# Patient Record
Sex: Female | Born: 1990 | Race: Black or African American | Hispanic: No | Marital: Single | Smoking: Current every day smoker
Health system: Southern US, Community
[De-identification: ages and names within clinical notes are randomized; demographics above are authoritative.]

## PROBLEM LIST (undated history)

## (undated) DIAGNOSIS — A539 Syphilis, unspecified: Secondary | ICD-10-CM

## (undated) HISTORY — DX: Syphilis, unspecified: A53.9

## (undated) HISTORY — PX: APPENDECTOMY: SHX54

---

## 2013-08-31 DIAGNOSIS — A539 Syphilis, unspecified: Secondary | ICD-10-CM

## 2013-08-31 HISTORY — DX: Syphilis, unspecified: A53.9

## 2016-04-13 ENCOUNTER — Other Ambulatory Visit: Payer: Self-pay | Admitting: Obstetrics and Gynecology

## 2016-04-13 ENCOUNTER — Ambulatory Visit (INDEPENDENT_AMBULATORY_CARE_PROVIDER_SITE_OTHER): Payer: Medicaid Other

## 2016-04-13 DIAGNOSIS — O358XX1 Maternal care for other (suspected) fetal abnormality and damage, fetus 1: Secondary | ICD-10-CM | POA: Diagnosis not present

## 2016-04-13 DIAGNOSIS — Z1389 Encounter for screening for other disorder: Secondary | ICD-10-CM

## 2016-04-13 DIAGNOSIS — O321XX1 Maternal care for breech presentation, fetus 1: Secondary | ICD-10-CM | POA: Diagnosis not present

## 2016-04-13 DIAGNOSIS — Z3A19 19 weeks gestation of pregnancy: Secondary | ICD-10-CM | POA: Diagnosis not present

## 2016-04-13 DIAGNOSIS — Z36 Encounter for antenatal screening of mother: Secondary | ICD-10-CM | POA: Diagnosis not present

## 2016-04-13 DIAGNOSIS — O3680X0 Pregnancy with inconclusive fetal viability, not applicable or unspecified: Secondary | ICD-10-CM

## 2016-04-13 DIAGNOSIS — O3492 Maternal care for abnormality of pelvic organ, unspecified, second trimester: Secondary | ICD-10-CM

## 2016-04-13 NOTE — Progress Notes (Signed)
US 18+5 wks,breech,cx 4.3 cm,fhr 153 bpm,efw 232 g,svp of fluid 4.2 cm,normal rt ov,simple corpus luteal cyst lt ov 3 x 2.8 x 2.6 cm,LVEICF 3.3 mm,post pl gr 0,anatomy complete

## 2016-04-15 ENCOUNTER — Encounter: Payer: Self-pay | Admitting: Advanced Practice Midwife

## 2016-04-15 ENCOUNTER — Ambulatory Visit (INDEPENDENT_AMBULATORY_CARE_PROVIDER_SITE_OTHER): Payer: Medicaid Other | Admitting: Advanced Practice Midwife

## 2016-04-15 VITALS — BP 108/60 | HR 70 | Ht 61.0 in | Wt 172.0 lb

## 2016-04-15 DIAGNOSIS — Z3A19 19 weeks gestation of pregnancy: Secondary | ICD-10-CM

## 2016-04-15 DIAGNOSIS — O0932 Supervision of pregnancy with insufficient antenatal care, second trimester: Secondary | ICD-10-CM

## 2016-04-15 DIAGNOSIS — Z3492 Encounter for supervision of normal pregnancy, unspecified, second trimester: Secondary | ICD-10-CM | POA: Diagnosis not present

## 2016-04-15 DIAGNOSIS — Z331 Pregnant state, incidental: Secondary | ICD-10-CM

## 2016-04-15 DIAGNOSIS — Z3482 Encounter for supervision of other normal pregnancy, second trimester: Secondary | ICD-10-CM

## 2016-04-15 DIAGNOSIS — Z3682 Encounter for antenatal screening for nuchal translucency: Secondary | ICD-10-CM

## 2016-04-15 DIAGNOSIS — Z0283 Encounter for blood-alcohol and blood-drug test: Secondary | ICD-10-CM

## 2016-04-15 DIAGNOSIS — Z1389 Encounter for screening for other disorder: Secondary | ICD-10-CM

## 2016-04-15 DIAGNOSIS — Z369 Encounter for antenatal screening, unspecified: Secondary | ICD-10-CM

## 2016-04-15 LAB — POCT URINALYSIS DIPSTICK
GLUCOSE UA: NEGATIVE
KETONES UA: NEGATIVE
Leukocytes, UA: NEGATIVE
Nitrite, UA: NEGATIVE
Protein, UA: NEGATIVE
RBC UA: NEGATIVE

## 2016-04-15 NOTE — Progress Notes (Signed)
error 

## 2016-04-15 NOTE — Patient Instructions (Signed)
Safe Medications in Pregnancy   Acne: Benzoyl Peroxide Salicylic Acid  Backache/Headache: Tylenol: 2 regular strength every 4 hours OR              2 Extra strength every 6 hours  Colds/Coughs/Allergies: Benadryl (alcohol free) 25 mg every 6 hours as needed Breath right strips Claritin Cepacol throat lozenges Chloraseptic throat spray Cold-Eeze- up to three times per day Cough drops, alcohol free Flonase (by prescription only) Guaifenesin Mucinex Robitussin DM (plain only, alcohol free) Saline nasal spray/drops Sudafed (pseudoephedrine) & Actifed ** use only after [redacted] weeks gestation and if you do not have high blood pressure Tylenol Vicks Vaporub Zinc lozenges Zyrtec   Constipation: Colace Ducolax suppositories Fleet enema Glycerin suppositories Metamucil Milk of magnesia Miralax Senokot Smooth move tea  Diarrhea: Kaopectate Imodium A-D  *NO pepto Bismol  Hemorrhoids: Anusol Anusol HC Preparation H Tucks  Indigestion: Tums Maalox Mylanta Zantac  Pepcid  Insomnia: Benadryl (alcohol free) 25mg every 6 hours as needed Tylenol PM Unisom, no Gelcaps  Leg Cramps: Tums MagGel  Nausea/Vomiting:  Bonine Dramamine Emetrol Ginger extract Sea bands Meclizine  Nausea medication to take during pregnancy:  Unisom (doxylamine succinate 25 mg tablets) Take one tablet daily at bedtime. If symptoms are not adequately controlled, the dose can be increased to a maximum recommended dose of two tablets daily (1/2 tablet in the morning, 1/2 tablet mid-afternoon and one at bedtime). Vitamin B6 100mg tablets. Take one tablet twice a day (up to 200 mg per day).  Skin Rashes: Aveeno products Benadryl cream or 25mg every 6 hours as needed Calamine Lotion 1% cortisone cream  Yeast infection: Gyne-lotrimin 7 Monistat 7   **If taking multiple medications, please check labels to avoid duplicating the same active ingredients **take medication as directed on  the label ** Do not exceed 4000 mg of tylenol in 24 hours **Do not take medications that contain aspirin or ibuprofen       Second Trimester of Pregnancy The second trimester is from week 13 through week 28, months 4 through 6. The second trimester is often a time when you feel your best. Your body has also adjusted to being pregnant, and you begin to feel better physically. Usually, morning sickness has lessened or quit completely, you may have more energy, and you may have an increase in appetite. The second trimester is also a time when the fetus is growing rapidly. At the end of the sixth month, the fetus is about 9 inches long and weighs about 1 pounds. You will likely begin to feel the baby move (quickening) between 18 and 20 weeks of the pregnancy. BODY CHANGES Your body goes through many changes during pregnancy. The changes vary from woman to woman.   Your weight will continue to increase. You will notice your lower abdomen bulging out.  You may begin to get stretch marks on your hips, abdomen, and breasts.  You may develop headaches that can be relieved by medicines approved by your health care provider.  You may urinate more often because the fetus is pressing on your bladder.  You may develop or continue to have heartburn as a result of your pregnancy.  You may develop constipation because certain hormones are causing the muscles that push waste through your intestines to slow down.  You may develop hemorrhoids or swollen, bulging veins (varicose veins).  You may have back pain because of the weight gain and pregnancy hormones relaxing your joints between the bones in your pelvis and as   a result of a shift in weight and the muscles that support your balance.  Your breasts will continue to grow and be tender.  Your gums may bleed and may be sensitive to brushing and flossing.  Dark spots or blotches (chloasma, mask of pregnancy) may develop on your face. This will likely  fade after the baby is born.  A dark line from your belly button to the pubic area (linea nigra) may appear. This will likely fade after the baby is born.  You may have changes in your hair. These can include thickening of your hair, rapid growth, and changes in texture. Some women also have hair loss during or after pregnancy, or hair that feels dry or thin. Your hair will most likely return to normal after your baby is born. WHAT TO EXPECT AT YOUR PRENATAL VISITS During a routine prenatal visit:  You will be weighed to make sure you and the fetus are growing normally.  Your blood pressure will be taken.  Your abdomen will be measured to track your baby's growth.  The fetal heartbeat will be listened to.  Any test results from the previous visit will be discussed. Your health care provider may ask you:  How you are feeling.  If you are feeling the baby move.  If you have had any abnormal symptoms, such as leaking fluid, bleeding, severe headaches, or abdominal cramping.  If you are using any tobacco products, including cigarettes, chewing tobacco, and electronic cigarettes.  If you have any questions. Other tests that may be performed during your second trimester include:  Blood tests that check for:  Low iron levels (anemia).  Gestational diabetes (between 24 and 28 weeks).  Rh antibodies.  Urine tests to check for infections, diabetes, or protein in the urine.  An ultrasound to confirm the proper growth and development of the baby.  An amniocentesis to check for possible genetic problems.  Fetal screens for spina bifida and Down syndrome.  HIV (human immunodeficiency virus) testing. Routine prenatal testing includes screening for HIV, unless you choose not to have this test. HOME CARE INSTRUCTIONS   Avoid all smoking, herbs, alcohol, and unprescribed drugs. These chemicals affect the formation and growth of the baby.  Do not use any tobacco products, including  cigarettes, chewing tobacco, and electronic cigarettes. If you need help quitting, ask your health care provider. You may receive counseling support and other resources to help you quit.  Follow your health care provider's instructions regarding medicine use. There are medicines that are either safe or unsafe to take during pregnancy.  Exercise only as directed by your health care provider. Experiencing uterine cramps is a good sign to stop exercising.  Continue to eat regular, healthy meals.  Wear a good support bra for breast tenderness.  Do not use hot tubs, steam rooms, or saunas.  Wear your seat belt at all times when driving.  Avoid raw meat, uncooked cheese, cat litter boxes, and soil used by cats. These carry germs that can cause birth defects in the baby.  Take your prenatal vitamins.  Take 1500-2000 mg of calcium daily starting at the 20th week of pregnancy until you deliver your baby.  Try taking a stool softener (if your health care provider approves) if you develop constipation. Eat more high-fiber foods, such as fresh vegetables or fruit and whole grains. Drink plenty of fluids to keep your urine clear or pale yellow.  Take warm sitz baths to soothe any pain or discomfort caused by   hemorrhoids. Use hemorrhoid cream if your health care provider approves.  If you develop varicose veins, wear support hose. Elevate your feet for 15 minutes, 3-4 times a day. Limit salt in your diet.  Avoid heavy lifting, wear low heel shoes, and practice good posture.  Rest with your legs elevated if you have leg cramps or low back pain.  Visit your dentist if you have not gone yet during your pregnancy. Use a soft toothbrush to brush your teeth and be gentle when you floss.  A sexual relationship may be continued unless your health care provider directs you otherwise.  Continue to go to all your prenatal visits as directed by your health care provider. SEEK MEDICAL CARE IF:   You have  dizziness.  You have mild pelvic cramps, pelvic pressure, or nagging pain in the abdominal area.  You have persistent nausea, vomiting, or diarrhea.  You have a bad smelling vaginal discharge.  You have pain with urination. SEEK IMMEDIATE MEDICAL CARE IF:   You have a fever.  You are leaking fluid from your vagina.  You have spotting or bleeding from your vagina.  You have severe abdominal cramping or pain.  You have rapid weight gain or loss.  You have shortness of breath with chest pain.  You notice sudden or extreme swelling of your face, hands, ankles, feet, or legs.  You have not felt your baby move in over an hour.  You have severe headaches that do not go away with medicine.  You have vision changes.   This information is not intended to replace advice given to you by your health care provider. Make sure you discuss any questions you have with your health care provider.   Document Released: 08/11/2001 Document Revised: 09/07/2014 Document Reviewed: 10/18/2012 Elsevier Interactive Patient Education 2016 [employer]  

## 2016-04-15 NOTE — Progress Notes (Addendum)
  Subjective:    Kendra Farmer is a G4P3000 6062w0d being seen today for her first obstetrical visit.  Her obstetrical history is significant for 3 term SVD.  Pregnancy history fully reviewed. Had been in jail for amonth ("old probabtion stuff").   Patient reports no complaints.  Vitals:   04/15/16 1004 04/15/16 1005 04/15/16 1030  BP: 108/60    Pulse: 70    Weight: 172 lb (78 kg)    Height:  5\' 1"  (1.549 m) 5\' 1"  (1.549 m)    HISTORY: OB History  Gravida Para Term Preterm AB Living  4 3 3     3   SAB TAB Ectopic Multiple Live Births          3    # Outcome Date GA Lbr Len/2nd Weight Sex Delivery Anes PTL Lv  4 Current           3 Term 06/30/15 6738w0d  6 lb 1 oz (2.75 kg) F Vag-Spont None N LIV  2 Term 08/13/14 153w0d  7 lb 1 oz (3.204 kg) F Vag-Spont Gen N LIV  1 Term 04/23/11 9053w0d  6 lb 1 oz (2.75 kg) F Vag-Spont None N LIV     Past Medical History:  Diagnosis Date  . Syphilis 2015   Past Surgical History:  Procedure Laterality Date  . APPENDECTOMY     History reviewed. No pertinent family history.   Exam                                      System:     Skin: normal coloration and turgor, no rashes    Neurologic: oriented, normal, normal mood   Extremities: normal strength, tone, and muscle mass   HEENT PERRLA   Mouth/Teeth mucous membranes moist, normal dentition   Neck supple and no masses   Cardiovascular: regular rate and rhythm   Respiratory:  appears well, vitals normal, no respiratory distress, acyanotic   Abdomen: soft, non-tender;  FHR: 150           Assessment:    Pregnancy: G4P3000 There are no active problems to display for this patient.       Plan:     Initial labs drawn. Continue prenatal vitamins  Problem list reviewed and updated  Reviewed recommended weight gain based on pre-gravid BMI  Encouraged well-balanced diet Genetic Screening discussed Quad Screen: requested.  Ultrasound discussed; fetal survey: results  reviewed.  Return in about 4 weeks (around 05/13/2016) for [name]  Record request (pap smear) [facility] , [name].  CRESENZO-DISHMAN,Ronen Bromwell 04/22/2016

## 2016-04-16 LAB — GC/CHLAMYDIA PROBE AMP
Chlamydia trachomatis, NAA: NEGATIVE
Neisseria gonorrhoeae by PCR: NEGATIVE

## 2016-04-17 LAB — URINE CULTURE

## 2016-04-22 ENCOUNTER — Encounter: Payer: Medicaid Other | Admitting: Women's Health

## 2016-04-22 DIAGNOSIS — Z3490 Encounter for supervision of normal pregnancy, unspecified, unspecified trimester: Secondary | ICD-10-CM | POA: Insufficient documentation

## 2016-04-22 DIAGNOSIS — O0930 Supervision of pregnancy with insufficient antenatal care, unspecified trimester: Secondary | ICD-10-CM | POA: Insufficient documentation

## 2016-04-22 LAB — PMP SCREEN PROFILE (10S), URINE
AMPHETAMINE SCRN UR: NEGATIVE ng/mL
Barbiturate Screen, Ur: NEGATIVE ng/mL
Benzodiazepine Screen, Urine: NEGATIVE ng/mL
CANNABINOIDS UR QL SCN: NEGATIVE ng/mL
COCAINE(METAB.) SCREEN, URINE: NEGATIVE ng/mL
Creatinine(Crt), U: 136.9 mg/dL (ref 20.0–300.0)
Methadone Scn, Ur: NEGATIVE ng/mL
OPIATE SCRN UR: NEGATIVE ng/mL
OXYCODONE+OXYMORPHONE UR QL SCN: NEGATIVE ng/mL
PCP SCRN UR: NEGATIVE ng/mL
Ph of Urine: 5.7 (ref 4.5–8.9)
Propoxyphene, Screen: NEGATIVE ng/mL

## 2016-04-22 LAB — CYSTIC FIBROSIS MUTATION 97
CF Image: 0
Interpretation: NOT DETECTED

## 2016-04-22 LAB — URINALYSIS, ROUTINE W REFLEX MICROSCOPIC
BILIRUBIN UA: NEGATIVE
Glucose, UA: NEGATIVE
Ketones, UA: NEGATIVE
Nitrite, UA: NEGATIVE
PH UA: 6 (ref 5.0–7.5)
Protein, UA: NEGATIVE
RBC UA: NEGATIVE
Specific Gravity, UA: 1.024 (ref 1.005–1.030)
Urobilinogen, Ur: 0.2 mg/dL (ref 0.2–1.0)

## 2016-04-22 LAB — CBC
HEMOGLOBIN: 10.6 g/dL — AB (ref 11.1–15.9)
Hematocrit: 32.8 % — ABNORMAL LOW (ref 34.0–46.6)
MCH: 25.8 pg — AB (ref 26.6–33.0)
MCHC: 32.3 g/dL (ref 31.5–35.7)
MCV: 80 fL (ref 79–97)
Platelets: 264 10*3/uL (ref 150–379)
RBC: 4.11 x10E6/uL (ref 3.77–5.28)
RDW: 15.9 % — ABNORMAL HIGH (ref 12.3–15.4)
WBC: 7.1 10*3/uL (ref 3.4–10.8)

## 2016-04-22 LAB — AFP, QUAD SCREEN
DIA Mom Value: 0.75
DIA Value (EIA): 128.62 pg/mL
DSR (BY AGE) 1 IN: 998
DSR (SECOND TRIMESTER) 1 IN: 9242
Gestational Age: 19 WEEKS
MATERNAL AGE AT EDD: 25.7 a
MSAFP MOM: 0.97
MSAFP: 47.8 ng/mL
MSHCG Mom: 1.47
MSHCG: 34209 m[IU]/mL
OSB RISK: 10000
PDF: 0
T18 (By Age): 1:3887 {titer}
Test Results:: NEGATIVE
UE3 VALUE: 1.47 ng/mL
Weight: 172 [lb_av]
uE3 Mom: 0.95

## 2016-04-22 LAB — RPR: RPR Ser Ql: NONREACTIVE

## 2016-04-22 LAB — MICROSCOPIC EXAMINATION: Casts: NONE SEEN /lpf

## 2016-04-22 LAB — ABO/RH: Rh Factor: POSITIVE

## 2016-04-22 LAB — HEPATITIS B SURFACE ANTIGEN: HEP B S AG: NEGATIVE

## 2016-04-22 LAB — SICKLE CELL SCREEN: SICKLE CELL SCREEN: NEGATIVE

## 2016-04-22 LAB — RUBELLA SCREEN: Rubella Antibodies, IGG: 1.4 index (ref >0.99)

## 2016-04-22 LAB — VARICELLA ZOSTER ANTIBODY, IGG: Varicella zoster IgG: <135 index — ABNORMAL LOW (ref >165)

## 2016-04-22 LAB — HIV ANTIBODY (ROUTINE TESTING W REFLEX): HIV SCREEN 4TH GENERATION: NONREACTIVE

## 2016-04-22 LAB — ANTIBODY SCREEN: ANTIBODY SCREEN: NEGATIVE

## 2016-04-27 ENCOUNTER — Encounter: Payer: Medicaid Other | Admitting: Women's Health

## 2016-04-29 ENCOUNTER — Encounter (HOSPITAL_COMMUNITY): Payer: Self-pay

## 2016-04-29 ENCOUNTER — Emergency Department (HOSPITAL_COMMUNITY): Payer: Worker's Compensation

## 2016-04-29 ENCOUNTER — Emergency Department (HOSPITAL_COMMUNITY)
Admission: EM | Admit: 2016-04-29 | Discharge: 2016-04-29 | Disposition: A | Discharge status: Home / Self Care | Payer: Worker's Compensation | Attending: Emergency Medicine | Admitting: Emergency Medicine

## 2016-04-29 DIAGNOSIS — Y990 Civilian activity done for income or pay: Secondary | ICD-10-CM | POA: Insufficient documentation

## 2016-04-29 DIAGNOSIS — S61210A Laceration without foreign body of right index finger without damage to nail, initial encounter: Secondary | ICD-10-CM | POA: Insufficient documentation

## 2016-04-29 DIAGNOSIS — S61219A Laceration without foreign body of unspecified finger without damage to nail, initial encounter: Secondary | ICD-10-CM

## 2016-04-29 DIAGNOSIS — Y939 Activity, unspecified: Secondary | ICD-10-CM | POA: Diagnosis not present

## 2016-04-29 DIAGNOSIS — Y929 Unspecified place or not applicable: Secondary | ICD-10-CM | POA: Diagnosis not present

## 2016-04-29 DIAGNOSIS — S61212A Laceration without foreign body of right middle finger without damage to nail, initial encounter: Secondary | ICD-10-CM | POA: Diagnosis not present

## 2016-04-29 DIAGNOSIS — W270XXA Contact with workbench tool, initial encounter: Secondary | ICD-10-CM | POA: Insufficient documentation

## 2016-04-29 DIAGNOSIS — Z87891 Personal history of nicotine dependence: Secondary | ICD-10-CM | POA: Diagnosis not present

## 2016-04-29 MED ORDER — POVIDONE-IODINE 10 % EX SOLN
CUTANEOUS | Status: AC
  Filled 2016-04-29: qty 118

## 2016-04-29 MED ORDER — LIDOCAINE HCL (PF) 2 % IJ SOLN
10.0000 mL | Freq: Once | INTRAMUSCULAR | Status: DC
  Filled 2016-04-29: qty 10

## 2016-04-29 MED ORDER — HYDROCODONE-ACETAMINOPHEN 5-325 MG PO TABS: 1.0000 | ORAL_TABLET | Freq: Once | ORAL | Status: DC

## 2016-04-29 MED ORDER — HYDROCODONE-ACETAMINOPHEN 5-325 MG PO TABS: 1.0000 | ORAL_TABLET | ORAL | 0 refills | Status: DC | PRN

## 2016-04-29 MED ORDER — HYDROCODONE-ACETAMINOPHEN 5-325 MG PO TABS
ORAL_TABLET | ORAL | Status: AC
  Filled 2016-04-29: qty 1

## 2016-04-29 MED ORDER — CEPHALEXIN 500 MG PO CAPS: 500.0000 mg | ORAL_CAPSULE | Freq: Four times a day (QID) | ORAL | 0 refills | Status: DC

## 2016-04-29 NOTE — Discharge Instructions (Signed)
As discussed,  call Dr. Melvyn Novasrtmann for a recheck of your finger injuries within the next several days.  In the interim, keep your wounds clean and covered.  Take the medicine prescribed to help prevent infection.  You may take the hydrocodone prescribed for pain relief.  This will make you drowsy - do not drive within 4 hours of taking this medication.

## 2016-04-29 NOTE — ED Triage Notes (Signed)
Pt cut tip of r index finger and middle finger on a saw at work today.  Part of the Tip of index finger appears amputated.

## 2016-04-30 NOTE — ED Provider Notes (Signed)
[facility]-EMER[facility]ENCY DEPT Provider Note   CSN: [id number] Arrival date & time: 04/29/16  1519     History   Chief Complaint Chief Complaint  Patient presents with  . Extremity Laceration    HPI Kendra Farmer is a 25 y.o.right handed female presenting with lacerations to her right index and long fingers which occurred at work prior to arrival when she accidentally reached too close to a moving saw blade.  She is right handed and is utd with her tetanus status.  She reports that she has sensation distal to the injury site.  The wounds bled copiously but has resolved with application of pressure. .  Of note, pt is currently 5 months pregnant.  HPI  Past Medical History:  Diagnosis Date  . Syphilis 2015    Patient Active Problem List   Diagnosis Date Noted  . Supervision of normal pregnancy 04/22/2016  . Late prenatal care 04/22/2016    Past Surgical History:  Procedure Laterality Date  . APPENDECTOMY      OB History    Gravida Para Term Preterm AB Living   4 3 3     3    SAB TAB Ectopic Multiple Live Births           3       Home Medications    Prior to Admission medications   Medication Sig Start Date End Date Taking? Authorizing Provider  cephALEXin (KEFLEX) 500 MG capsule Take 1 capsule (500 mg total) by mouth 4 (four) times daily. 04/29/16   Burgess AmorJulie Cayetano Mikita, PA-C  ferrous sulfate 325 (65 FE) MG tablet Take 325 mg by mouth daily with breakfast.    Historical Provider, MD  HYDROcodone-acetaminophen (NORCO/VICODIN) 5-325 MG tablet Take 1 tablet by mouth every 4 (four) hours as needed. 04/29/16   Burgess AmorJulie Devarius Nelles, PA-C  HYDROcodone-acetaminophen (NORCO/VICODIN) 5-325 MG tablet Take 1 tablet by mouth every 4 (four) hours as needed. 04/29/16   Burgess AmorJulie Willa Brocks, PA-C  Prenatal Vit-Fe Fumarate-FA (MULTIVITAMIN-PRENATAL) 27-0.8 MG TABS tablet Take 1 tablet by mouth daily at 12 noon.    Historical Provider, MD    Family History No family history on file.  Social History Social History    Substance Use Topics  . Smoking status: Former Games developermoker  . Smokeless tobacco: Never Used  . Alcohol use No     Allergies   Review of patient's allergies indicates no known allergies.   Review of Systems Review of Systems  Constitutional: Negative for chills and fever.  Respiratory: Negative for shortness of breath and wheezing.   Skin: Positive for wound.  Neurological: Negative for weakness and numbness.     Physical Exam Updated Vital Signs BP 110/88 (BP Location: Left Arm)   Pulse 86   Temp 98.5 F (36.9 C) (Oral)   Resp 20   Ht 5\' 1"  (1.549 m)   Wt 78 kg   LMP 12/04/2015 (Exact Date)   SpO2 98%   BMI 32.50 kg/m   Physical Exam  Constitutional: She is oriented to person, place, and time. She appears well-developed and well-nourished.  HENT:  Head: Normocephalic.  Cardiovascular: Normal rate.   Pulmonary/Chest: Effort normal.  Musculoskeletal: She exhibits tenderness.  Neurological: She is alert and oriented to person, place, and time. No sensory deficit.  Skin: Laceration noted.  Deep avulsive soft tissue injuries to the distal volar tuft of right index and long fingers both injuries approximately 1 cm in greatest diameter. Hemostatic.  Tip of both fingers including nails and nail beds intact.  Distal sensation intact. Hemostatic.     ED Treatments / Results  Labs (all labs ordered are listed, but only abnormal results are displayed) Labs Reviewed - No data to display  EKG  EKG Interpretation None       Radiology Dg Hand Complete Right  Result Date: 04/29/2016 CLINICAL DATA:  Avulsive lacerations to index and middle fingers using saw at work today. Initial encounter. EXAM: RIGHT HAND - COMPLETE 3+ VIEW COMPARISON:  None. FINDINGS: There is no evidence of fracture or dislocation. There is no evidence of arthropathy or other focal bone abnormality. Soft tissue lacerations are seen involving the distal index and middle fingers, however there is no  evidence of radiopaque foreign body. IMPRESSION: Soft tissue lacerations involving distal index and middle fingers. No evidence of fracture or radiopaque foreign body. Electronically Signed   By: Myles Rosenthal M.D.   On: 04/29/2016 16:27    Procedures .Nerve Block Date/Time: 04/29/2016 3:50 PM Performed by: Burgess Amor Authorized by: Burgess Amor   Consent:    Consent obtained:  Verbal   Consent given by:  Patient   Risks discussed:  Infection, bleeding, pain and swelling   Alternatives discussed:  No treatment Indications:    Indications:  Pain relief Location:    Body area:  Upper extremity   Upper extremity nerve blocked: digital.   Laterality:  Right Pre-procedure details:    Skin preparation:  Alcohol   Preparation: Patient was prepped and draped in usual sterile fashion   Skin anesthesia (see MAR for exact dosages):    Skin anesthesia method:  Local infiltration Procedure details (see MAR for exact dosages):    Block needle gauge:  25 G   Anesthetic injected:  Lidocaine 2% w/o epi   Steroid injected:  None   Additive injected:  None   Injection procedure:  Anatomic landmarks identified   Paresthesia:  Immediately resolved Post-procedure details:    Dressing:  Sterile dressing   Outcome:  Pain relieved   Patient tolerance of procedure:  Tolerated well, no immediate complications   (including critical care time)    Medications Ordered in ED Medications - No data to display   Initial Impression / Assessment and Plan / ED Course  I have reviewed the triage vital signs and the nursing notes.  Pertinent labs & imaging results that were available during my care of the patient were reviewed by me and considered in my medical decision making (see chart for details).  Clinical Course    Pt's injuries discussed with Dr. Romeo Apple, my concern given the depth of the wounds, pt may need f/u revision/grafting or other procedure as these wounds may be too deep for adequate  granulation and self healing.  Deferred to hand specialty.   Call placed to Dr. Melvyn Novas on call for hand today.  No return call after 30 minutes and pt unwilling to wait longer.  Pt was given referral information for f/u care and understands need to call for f/u office recheck.  Employer at bedside also understands and will help pt get f/u care as needed. She was placed on keflex to help infection prophylaxis, hydrocodone prescribed prn pain.  Bulky dressings applied after application of xeroform on the wounds.    Final Clinical Impressions(s) / ED Diagnoses   Final diagnoses:  Finger laceration, initial encounter    New Prescriptions Discharge Medication List as of 04/29/2016  5:58 PM    START taking these medications   Details  cephALEXin (KEFLEX) 500 MG  capsule Take 1 capsule (500 mg total) by mouth 4 (four) times daily., Starting Wed 04/29/2016, Print    !! HYDROcodone-acetaminophen (NORCO/VICODIN) 5-325 MG tablet Take 1 tablet by mouth every 4 (four) hours as needed., Starting Wed 04/29/2016, Print    !! HYDROcodone-acetaminophen (NORCO/VICODIN) 5-325 MG tablet Take 1 tablet by mouth every 4 (four) hours as needed., Starting Wed 04/29/2016, Print     !! - Potential duplicate medications found. Please discuss with provider.       Burgess Amor, PA-C 04/30/16 1610    Raeford Razor, MD 05/06/16 [phone]

## 2016-05-05 MED FILL — Hydrocodone-Acetaminophen Tab 5-325 MG: ORAL | Qty: 6 | Status: AC

## 2016-05-13 ENCOUNTER — Ambulatory Visit (INDEPENDENT_AMBULATORY_CARE_PROVIDER_SITE_OTHER): Payer: Medicaid Other | Admitting: Women's Health

## 2016-05-13 ENCOUNTER — Encounter: Payer: Self-pay | Admitting: Women's Health

## 2016-05-13 VITALS — BP 98/58 | HR 66 | Wt 169.0 lb

## 2016-05-13 DIAGNOSIS — Z3492 Encounter for supervision of normal pregnancy, unspecified, second trimester: Secondary | ICD-10-CM

## 2016-05-13 DIAGNOSIS — Z1389 Encounter for screening for other disorder: Secondary | ICD-10-CM

## 2016-05-13 DIAGNOSIS — O283 Abnormal ultrasonic finding on antenatal screening of mother: Secondary | ICD-10-CM

## 2016-05-13 DIAGNOSIS — Z331 Pregnant state, incidental: Secondary | ICD-10-CM

## 2016-05-13 LAB — POCT URINALYSIS DIPSTICK
Blood, UA: NEGATIVE
GLUCOSE UA: NEGATIVE
KETONES UA: NEGATIVE
LEUKOCYTES UA: NEGATIVE
NITRITE UA: NEGATIVE
Protein, UA: NEGATIVE

## 2016-05-13 NOTE — Patient Instructions (Signed)
You will have your sugar test next visit.  Please do not eat or drink anything after midnight the night before you come, not even water.  You will be here for at least two hours.     Call the office ([phone]) or go to [facility] if:  You begin to have strong, frequent contractions  Your water breaks.  Sometimes it is a big gush of fluid, sometimes it is just a trickle that keeps getting your panties wet or running down your legs  You have vaginal bleeding.  It is normal to have a small amount of spotting if your cervix was checked.   You don't feel your baby moving like normal.  If you don't, get you something to eat and drink and lay down and focus on feeling your baby move.   If your baby is still not moving like normal, you should call the office or go to [facility].  Second Trimester of Pregnancy The second trimester is from week 13 through week 28, months 4 through 6. The second trimester is often a time when you feel your best. Your body has also adjusted to being pregnant, and you begin to feel better physically. Usually, morning sickness has lessened or quit completely, you may have more energy, and you may have an increase in appetite. The second trimester is also a time when the fetus is growing rapidly. At the end of the sixth month, the fetus is about 9 inches long and weighs about 1 pounds. You will likely begin to feel the baby move (quickening) between 18 and 20 weeks of the pregnancy. BODY CHANGES Your body goes through many changes during pregnancy. The changes vary from woman to woman.   Your weight will continue to increase. You will notice your lower abdomen bulging out.  You may begin to get stretch marks on your hips, abdomen, and breasts.  You may develop headaches that can be relieved by medicines approved by your health care provider.  You may urinate more often because the fetus is pressing on your bladder.  You may develop or continue to have  heartburn as a result of your pregnancy.  You may develop constipation because certain hormones are causing the muscles that push waste through your intestines to slow down.  You may develop hemorrhoids or swollen, bulging veins (varicose veins).  You may have back pain because of the weight gain and pregnancy hormones relaxing your joints between the bones in your pelvis and as a result of a shift in weight and the muscles that support your balance.  Your breasts will continue to grow and be tender.  Your gums may bleed and may be sensitive to brushing and flossing.  Dark spots or blotches (chloasma, mask of pregnancy) may develop on your face. This will likely fade after the baby is born.  A dark line from your belly button to the pubic area (linea nigra) may appear. This will likely fade after the baby is born.  You may have changes in your hair. These can include thickening of your hair, rapid growth, and changes in texture. Some women also have hair loss during or after pregnancy, or hair that feels dry or thin. Your hair will most likely return to normal after your baby is born. WHAT TO EXPECT AT YOUR PRENATAL VISITS During a routine prenatal visit:  You will be weighed to make sure you and the fetus are growing normally.  Your blood pressure will be taken.    Your abdomen will be measured to track your baby's growth.  The fetal heartbeat will be listened to.  Any test results from the previous visit will be discussed. Your health care provider may ask you:  How you are feeling.  If you are feeling the baby move.  If you have had any abnormal symptoms, such as leaking fluid, bleeding, severe headaches, or abdominal cramping.  If you have any questions. Other tests that may be performed during your second trimester include:  Blood tests that check for:  Low iron levels (anemia).  Gestational diabetes (between 24 and 28 weeks).  Rh antibodies.  Urine tests to check  for infections, diabetes, or protein in the urine.  An ultrasound to confirm the proper growth and development of the baby.  An amniocentesis to check for possible genetic problems.  Fetal screens for spina bifida and Down syndrome. HOME CARE INSTRUCTIONS   Avoid all smoking, herbs, alcohol, and unprescribed drugs. These chemicals affect the formation and growth of the baby.  Follow your health care provider's instructions regarding medicine use. There are medicines that are either safe or unsafe to take during pregnancy.  Exercise only as directed by your health care provider. Experiencing uterine cramps is a good sign to stop exercising.  Continue to eat regular, healthy meals.  Wear a good support bra for breast tenderness.  Do not use hot tubs, steam rooms, or saunas.  Wear your seat belt at all times when driving.  Avoid raw meat, uncooked cheese, cat litter boxes, and soil used by cats. These carry germs that can cause birth defects in the baby.  Take your prenatal vitamins.  Try taking a stool softener (if your health care provider approves) if you develop constipation. Eat more high-fiber foods, such as fresh vegetables or fruit and whole grains. Drink plenty of fluids to keep your urine clear or pale yellow.  Take warm sitz baths to soothe any pain or discomfort caused by hemorrhoids. Use hemorrhoid cream if your health care provider approves.  If you develop varicose veins, wear support hose. Elevate your feet for 15 minutes, 3-4 times a day. Limit salt in your diet.  Avoid heavy lifting, wear low heel shoes, and practice good posture.  Rest with your legs elevated if you have leg cramps or low back pain.  Visit your dentist if you have not gone yet during your pregnancy. Use a soft toothbrush to brush your teeth and be gentle when you floss.  A sexual relationship may be continued unless your health care provider directs you otherwise.  Continue to go to all your  prenatal visits as directed by your health care provider. SEEK MEDICAL CARE IF:   You have dizziness.  You have mild pelvic cramps, pelvic pressure, or nagging pain in the abdominal area.  You have persistent nausea, vomiting, or diarrhea.  You have a bad smelling vaginal discharge.  You have pain with urination. SEEK IMMEDIATE MEDICAL CARE IF:   You have a fever.  You are leaking fluid from your vagina.  You have spotting or bleeding from your vagina.  You have severe abdominal cramping or pain.  You have rapid weight gain or loss.  You have shortness of breath with chest pain.  You notice sudden or extreme swelling of your face, hands, ankles, feet, or legs.  You have not felt your baby move in over an hour.  You have severe headaches that do not go away with medicine.  You have vision changes.   Document Released: 08/11/2001 Document Revised: 08/22/2013 Document Reviewed: 10/18/2012 ExitCare Patient Information 2015 [employer]. This information is not intended to replace advice given to you by your health care provider. Make sure you discuss any questions you have with your health care provider.     

## 2016-05-13 NOTE — Progress Notes (Signed)
Low-risk OB appointment [id number] 3819w0d Estimated Date of Delivery: 09/09/16 BP (!) 98/58   Pulse 66   Wt 169 lb (76.7 kg)   LMP 12/04/2015 (Exact Date)   BMI 31.93 kg/m   BP, weight, reviewed.  Unable to void- will try again before she leaves. Refer to obstetrical flow sheet for FH & FHR.  Reports good fm.  Denies regular uc's, lof, vb, or uti s/s. No complaints. Reviewed anatomy u/s- was unaware of EICF- gave printed info, discussed neg genetic screening, no need for f/u. Discussed ptl s/s, fm Plan:  Continue routine obstetrical care  F/U in 4wks for OB appointment and pn2

## 2016-06-11 ENCOUNTER — Encounter: Payer: Medicaid Other | Admitting: Women's Health

## 2016-06-11 ENCOUNTER — Other Ambulatory Visit: Payer: Medicaid Other

## 2016-06-17 ENCOUNTER — Encounter: Payer: Self-pay | Admitting: Women's Health

## 2016-06-17 ENCOUNTER — Ambulatory Visit (INDEPENDENT_AMBULATORY_CARE_PROVIDER_SITE_OTHER): Payer: Medicaid Other | Admitting: Women's Health

## 2016-06-17 ENCOUNTER — Other Ambulatory Visit: Payer: Medicaid Other

## 2016-06-17 VITALS — BP 116/70 | HR 76 | Wt 173.0 lb

## 2016-06-17 DIAGNOSIS — Z331 Pregnant state, incidental: Secondary | ICD-10-CM

## 2016-06-17 DIAGNOSIS — Z131 Encounter for screening for diabetes mellitus: Secondary | ICD-10-CM

## 2016-06-17 DIAGNOSIS — Z23 Encounter for immunization: Secondary | ICD-10-CM

## 2016-06-17 DIAGNOSIS — Z3483 Encounter for supervision of other normal pregnancy, third trimester: Secondary | ICD-10-CM

## 2016-06-17 DIAGNOSIS — Z1389 Encounter for screening for other disorder: Secondary | ICD-10-CM

## 2016-06-17 DIAGNOSIS — Z3A38 38 weeks gestation of pregnancy: Secondary | ICD-10-CM

## 2016-06-17 LAB — POCT URINALYSIS DIPSTICK
Blood, UA: NEGATIVE
Glucose, UA: NEGATIVE
Ketones, UA: NEGATIVE
LEUKOCYTES UA: NEGATIVE
Nitrite, UA: NEGATIVE
PROTEIN UA: NEGATIVE

## 2016-06-17 NOTE — Progress Notes (Signed)
Low-risk OB appointment [id number] 5866w0d Estimated Date of Delivery: 09/09/16 BP 116/70   Pulse 76   Wt 173 lb (78.5 kg)   LMP 12/04/2015 (Exact Date)   BMI 32.69 kg/m   BP, weight, and urine reviewed.  Refer to obstetrical flow sheet for FH & FHR.  Reports good fm.  Denies regular uc's, lof, vb, or uti s/s. No complaints. Wants BTL, discussed in-hospital vs. Interval salpingectomy to also lower risk of ovarian cancer in future- pt wants in-hospital BTL. Discussed risks/benefits, consent signed today Reviewed ptl s/s, fkc. Recommended Tdap at HD/PCP per CDC guidelines.  Plan:  Continue routine obstetrical care  F/U in 4wks for OB appointment  PN2 and flu shot today

## 2016-06-17 NOTE — Patient Instructions (Signed)

## 2016-06-18 LAB — CBC
HEMATOCRIT: 34.2 % (ref 34.0–46.6)
Hemoglobin: 11.6 g/dL (ref 11.1–15.9)
MCH: 26.9 pg (ref 26.6–33.0)
MCHC: 33.9 g/dL (ref 31.5–35.7)
MCV: 79 fL (ref 79–97)
PLATELETS: 228 10*3/uL (ref 150–379)
RBC: 4.31 x10E6/uL (ref 3.77–5.28)
RDW: 14.3 % (ref 12.3–15.4)
WBC: 6.5 10*3/uL (ref 3.4–10.8)

## 2016-06-18 LAB — GLUCOSE TOLERANCE, 2 HOURS W/ 1HR
GLUCOSE, FASTING: 77 mg/dL (ref 65–91)
Glucose, 1 hour: 113 mg/dL (ref 65–179)
Glucose, 2 hour: 110 mg/dL (ref 65–152)

## 2016-06-18 LAB — HIV ANTIBODY (ROUTINE TESTING W REFLEX): HIV SCREEN 4TH GENERATION: NONREACTIVE

## 2016-06-18 LAB — RPR: RPR: NONREACTIVE

## 2016-06-18 LAB — ANTIBODY SCREEN: ANTIBODY SCREEN: NEGATIVE

## 2016-07-15 ENCOUNTER — Encounter: Payer: Medicaid Other | Admitting: Advanced Practice Midwife

## 2016-07-16 ENCOUNTER — Ambulatory Visit (INDEPENDENT_AMBULATORY_CARE_PROVIDER_SITE_OTHER): Payer: Medicaid Other | Admitting: Obstetrics and Gynecology

## 2016-07-16 VITALS — BP 82/40 | HR 69 | Wt 176.6 lb

## 2016-07-16 DIAGNOSIS — Z3493 Encounter for supervision of normal pregnancy, unspecified, third trimester: Secondary | ICD-10-CM

## 2016-07-16 DIAGNOSIS — Z3A32 32 weeks gestation of pregnancy: Secondary | ICD-10-CM

## 2016-07-16 DIAGNOSIS — Z3403 Encounter for supervision of normal first pregnancy, third trimester: Secondary | ICD-10-CM

## 2016-07-16 DIAGNOSIS — Z1389 Encounter for screening for other disorder: Secondary | ICD-10-CM

## 2016-07-16 DIAGNOSIS — Z331 Pregnant state, incidental: Secondary | ICD-10-CM

## 2016-07-16 LAB — POCT URINALYSIS DIPSTICK
Glucose, UA: NEGATIVE
Ketones, UA: NEGATIVE
Leukocytes, UA: NEGATIVE
NITRITE UA: NEGATIVE
RBC UA: NEGATIVE

## 2016-07-16 NOTE — Progress Notes (Signed)
Patient ID: Kendra Farmer, female   DOB: 03/22/1991, 25 y.o.   MRN: [id number] 755w1d Estimated Date of Delivery: 09/09/16 [name]  Patient reports good fetal movement, denies any bleeding and no rupture of membranes symptoms or regular contractions. Patient complaints: no complaints at this time. Pt voices that she would like a tubal ligation and that she has already signed the paperwork. Pt hasn't tried any medications for the relief of her symptoms.  Blood pressure (!) 82/40, pulse 69, weight 176 lb 9.6 oz (80.1 kg), last menstrual period 12/04/2015. refer to the ob flow sheet for FH and FHR, also BP, Wt, Urine results:notable for trace of protein, otherwise negative.                          Physical Examination:  General appearance - alert, well appearing, and in no distress Abdomen - FH 32 cm                   -FHR 138 soft, nontender, nondistended, no masses or organomegaly  Discussion: 1. Discussed with pt  The TIMING of TUBAL, risks and benefits of tubal ligation, endometrial ablation, and reasons for chosing in hospital vs delayed interval tubal at 4wk or 6 months..  At end of discussion, pt had opportunity to ask questions and has no further questions at this time.   Specific discussion of tubal ligation timing, endometrial ablation, and  as noted above. Greater than 50% was spent in counseling and coordination of care with the patient.   Total time greater than: 15 minutes.                                              Questions were answered. Assessment: [name] [id number] @ 215w1d  Plan:  Continued routine obstetrical care,  F/u in 2 weeks for routine OB  By signing my name below, I, Soijett Blue, attest that this documentation has been prepared under the direction and in the presence of Tilda BurrowJohn V Kerryn Tennant, MD. Electronically Signed: Soijett Blue, ED Scribe. 07/16/16. 9:41 AM.  I personally performed the services described in this documentation, which was SCRIBED in my  presence. The recorded information has been reviewed and considered accurate. It has been edited as necessary during review. Tilda BurrowFERGUSON,Jodie Cavey V, MD

## 2016-07-30 ENCOUNTER — Encounter: Payer: Medicaid Other | Admitting: Women's Health

## 2016-07-30 ENCOUNTER — Encounter: Payer: Medicaid Other | Admitting: Obstetrics & Gynecology

## 2016-07-31 ENCOUNTER — Ambulatory Visit (INDEPENDENT_AMBULATORY_CARE_PROVIDER_SITE_OTHER): Payer: Medicaid Other | Admitting: Obstetrics and Gynecology

## 2016-07-31 VITALS — BP 110/72 | HR 68 | Wt 175.6 lb

## 2016-07-31 DIAGNOSIS — Z3483 Encounter for supervision of other normal pregnancy, third trimester: Secondary | ICD-10-CM

## 2016-07-31 DIAGNOSIS — Z3A34 34 weeks gestation of pregnancy: Secondary | ICD-10-CM

## 2016-07-31 DIAGNOSIS — Z331 Pregnant state, incidental: Secondary | ICD-10-CM

## 2016-07-31 DIAGNOSIS — Z3403 Encounter for supervision of normal first pregnancy, third trimester: Secondary | ICD-10-CM

## 2016-07-31 DIAGNOSIS — Z1389 Encounter for screening for other disorder: Secondary | ICD-10-CM

## 2016-07-31 LAB — POCT URINALYSIS DIPSTICK
GLUCOSE UA: NEGATIVE
KETONES UA: NEGATIVE
Leukocytes, UA: NEGATIVE
NITRITE UA: NEGATIVE
Protein, UA: NEGATIVE
RBC UA: NEGATIVE

## 2016-07-31 NOTE — Progress Notes (Signed)
[id number]  Estimated Date of Delivery: 09/09/16 [name] 6069w2d  Blood pressure 110/72, pulse 68, weight 175 lb 9.6 oz (79.7 kg), last menstrual period 12/04/2015.   Urine results:negative  Chief Complaint  Patient presents with  . Routine Prenatal Visit    Patient complaints: none at this time. Patient reports   good fetal movement,                           denies any bleeding , rupture of membranes,or regular contractions.   refer to the ob flow sheet for FH and FHR, ,                          Physical Examination: General appearance - alert, well appearing, and in no distress                                      Abdomen - FH 35 ,                                                         -FHR 144                                                         soft, nontender, nondistended, no masses or organomegaly                                      Pelvic - examination not indicated                                            Questions were answered. Assessment: [name] [id number] @ 7969w2d Estimated Date of Delivery: 09/09/16   Plan:  Continued routine obstetrical care,   F/u in 2 weeks for [name]   By signing my name below, I, Kendra Farmer, attest that this documentation has been prepared under the direction and in the presence of Christin BachJohn Marylan Glore, MD. Electronically Signed: Sonum Farmer, Neurosurgeoncribe. 07/31/16. 11:15 AM.  I personally performed the services described in this documentation, which was SCRIBED in my presence. The recorded information has been reviewed and considered accurate. It has been edited as necessary during review. Tilda BurrowFERGUSON,Findlay Dagher V, MD

## 2016-08-14 ENCOUNTER — Encounter: Payer: Medicaid Other | Admitting: Women's Health

## 2016-08-20 ENCOUNTER — Encounter: Payer: Self-pay | Admitting: Obstetrics & Gynecology

## 2016-08-20 ENCOUNTER — Ambulatory Visit (INDEPENDENT_AMBULATORY_CARE_PROVIDER_SITE_OTHER): Payer: Medicaid Other | Admitting: Obstetrics & Gynecology

## 2016-08-20 VITALS — BP 100/60 | HR 76 | Wt 180.0 lb

## 2016-08-20 DIAGNOSIS — Z3483 Encounter for supervision of other normal pregnancy, third trimester: Secondary | ICD-10-CM

## 2016-08-20 DIAGNOSIS — Z331 Pregnant state, incidental: Secondary | ICD-10-CM

## 2016-08-20 DIAGNOSIS — Z1389 Encounter for screening for other disorder: Secondary | ICD-10-CM

## 2016-08-20 DIAGNOSIS — Z369 Encounter for antenatal screening, unspecified: Secondary | ICD-10-CM

## 2016-08-20 DIAGNOSIS — Z3403 Encounter for supervision of normal first pregnancy, third trimester: Secondary | ICD-10-CM

## 2016-08-20 DIAGNOSIS — Z3A38 38 weeks gestation of pregnancy: Secondary | ICD-10-CM

## 2016-08-20 DIAGNOSIS — Z3685 Encounter for antenatal screening for Streptococcus B: Secondary | ICD-10-CM

## 2016-08-20 LAB — OB RESULTS CONSOLE GBS: STREP GROUP B AG: NEGATIVE

## 2016-08-20 NOTE — Progress Notes (Signed)
[id number] 9258w1d Estimated Date of Delivery: 09/09/16  Blood pressure 100/60, pulse 76, weight 180 lb (81.6 kg), last menstrual period 12/04/2015.   BP weight and urine results all reviewed and noted.  Please refer to the obstetrical flow sheet for the fundal height and fetal heart rate documentation:  Patient reports good fetal movement, denies any bleeding and no rupture of membranes symptoms or regular contractions. Patient is without complaints. All questions were answered.  Orders Placed This Encounter  Procedures  . GC/Chlamydia Probe Amp  . Strep Gp B NAA  . POCT urinalysis dipstick    Plan:  Continued routine obstetrical care,   Return in about 1 week (around 08/27/2016) for [name].

## 2016-08-22 LAB — GC/CHLAMYDIA PROBE AMP
Chlamydia trachomatis, NAA: NEGATIVE
NEISSERIA GONORRHOEAE BY PCR: NEGATIVE

## 2016-08-22 LAB — STREP GP B NAA: Strep Gp B NAA: NEGATIVE

## 2016-08-27 ENCOUNTER — Encounter: Payer: Medicaid Other | Admitting: Women's Health

## 2016-08-28 ENCOUNTER — Encounter: Payer: Self-pay | Admitting: Women's Health

## 2016-08-28 ENCOUNTER — Ambulatory Visit (INDEPENDENT_AMBULATORY_CARE_PROVIDER_SITE_OTHER): Payer: Medicaid Other | Admitting: Women's Health

## 2016-08-28 VITALS — BP 113/58 | HR 68 | Wt 181.0 lb

## 2016-08-28 DIAGNOSIS — Z1389 Encounter for screening for other disorder: Secondary | ICD-10-CM

## 2016-08-28 DIAGNOSIS — Z3483 Encounter for supervision of other normal pregnancy, third trimester: Secondary | ICD-10-CM

## 2016-08-28 DIAGNOSIS — Z331 Pregnant state, incidental: Secondary | ICD-10-CM

## 2016-08-28 DIAGNOSIS — O26893 Other specified pregnancy related conditions, third trimester: Secondary | ICD-10-CM | POA: Diagnosis not present

## 2016-08-28 DIAGNOSIS — O26853 Spotting complicating pregnancy, third trimester: Secondary | ICD-10-CM

## 2016-08-28 DIAGNOSIS — Z3A38 38 weeks gestation of pregnancy: Secondary | ICD-10-CM

## 2016-08-28 DIAGNOSIS — N898 Other specified noninflammatory disorders of vagina: Secondary | ICD-10-CM

## 2016-08-28 LAB — POCT URINALYSIS DIPSTICK
Glucose, UA: NEGATIVE
KETONES UA: NEGATIVE
Nitrite, UA: NEGATIVE
PROTEIN UA: NEGATIVE
RBC UA: NEGATIVE

## 2016-08-28 LAB — POCT WET PREP (WET MOUNT)
Clue Cells Wet Prep Whiff POC: POSITIVE
TRICHOMONAS WET PREP HPF POC: ABSENT

## 2016-08-28 MED ORDER — METRONIDAZOLE 500 MG PO TABS: 500.0000 mg | ORAL_TABLET | Freq: Two times a day (BID) | ORAL | 0 refills | Status: DC

## 2016-08-28 NOTE — Patient Instructions (Addendum)
[employer] the office [phone]) or go to [facility] if:  You begin to have strong, frequent contractions  Your water breaks.  Sometimes it is a big gush of fluid, sometimes it is just a trickle that keeps getting your panties wet or running down your legs  You have vaginal bleeding.  It is normal to have a small amount of spotting if your cervix was checked.   You don't feel your baby moving like normal.  If you don't, get you something to eat and drink and lay down and focus on feeling your baby move.  You should feel at least 10 movements in 2 hours.  If you don't, you should call the office or go to [facility].     [facility] Contractions Contractions of the uterus can occur throughout pregnancy. Contractions are not always a sign that you are in labor.  WHAT ARE BRAXTON HICKS CONTRACTIONS?  Contractions that occur before labor are called Braxton Hicks contractions, or false labor. Toward the end of pregnancy (32-34 weeks), these contractions can develop more often and may become more forceful. This is not true labor because these contractions do not result in opening (dilatation) and thinning of the cervix. They are sometimes difficult to tell apart from true labor because these contractions can be forceful and people have different pain tolerances. You should not feel embarrassed if you go to the hospital with false labor. Sometimes, the only way to tell if you are in true labor is for your health care provider to look for changes in the cervix. If there are no prenatal problems or other health problems associated with the pregnancy, it is completely safe to be sent home with false labor and await the onset of true labor. HOW CAN YOU TELL THE DIFFERENCE BETWEEN TRUE AND FALSE LABOR? False Labor   The contractions of false labor are usually shorter and not as hard as those of true labor.   The contractions are usually irregular.   The contractions are  often felt in the front of the lower abdomen and in the groin.   The contractions may go away when you walk around or change positions while lying down.   The contractions get weaker and are shorter lasting as time goes on.   The contractions do not usually become progressively stronger, regular, and closer together as with true labor.  True Labor   Contractions in true labor last 30-70 seconds, become very regular, usually become more intense, and increase in frequency.   The contractions do not go away with walking.   The discomfort is usually felt in the top of the uterus and spreads to the lower abdomen and low back.   True labor can be determined by your health care provider with an exam. This will show that the cervix is dilating and getting thinner.  WHAT TO REMEMBER  Keep up with your usual exercises and follow other instructions given by your health care provider.   Take medicines as directed by your health care provider.   Keep your regular prenatal appointments.   Eat and drink lightly if you think you are going into labor.   If Braxton Hicks contractions are making you uncomfortable:   Change your position from lying down or resting to walking, or from walking to resting.   Sit and rest in a tub of warm water.   Drink 2-3 glasses of water. Dehydration may cause these contractions.   Do slow and  deep breathing several times an hour.  WHEN SHOULD I SEEK IMMEDIATE MEDICAL CARE? Seek immediate medical care if:  Your contractions become stronger, more regular, and closer together.   You have fluid leaking or gushing from your vagina.   You have a fever.   You pass blood-tinged mucus.   You have vaginal bleeding.   You have continuous abdominal pain.   You have low back pain that you never had before.   You feel your baby's head pushing down and causing pelvic pressure.   Your baby is not moving as much as it used to.  This  information is not intended to replace advice given to you by your health care provider. Make sure you discuss any questions you have with your health care provider. Document Released: 08/17/2005 Document Revised: 12/09/2015 Document Reviewed: 05/29/2013 Elsevier Interactive Patient Education  2017 Elsevier Inc.   Bacterial Vaginosis Bacterial vaginosis is a vaginal infection that occurs when the normal balance of bacteria in the vagina is disrupted. It results from an overgrowth of certain bacteria. This is the most common vaginal infection among women ages 7115-44. Because bacterial vaginosis increases your risk for STIs (sexually transmitted infections), getting treated can help reduce your risk for chlamydia, gonorrhea, herpes, and HIV (human immunodeficiency virus). Treatment is also important for preventing complications in pregnant women, because this condition can cause an early (premature) delivery. What are the causes? This condition is caused by an increase in harmful bacteria that are normally present in small amounts in the vagina. However, the reason that the condition develops is not fully understood. What increases the risk? The following factors may make you more likely to develop this condition:  Having a new sexual partner or multiple sexual partners.  Having unprotected sex.  Douching.  Having an intrauterine device (IUD).  Smoking.  Drug and alcohol abuse.  Taking certain antibiotic medicines.  Being pregnant. You cannot get bacterial vaginosis from toilet seats, bedding, swimming pools, or contact with objects around you. What are the signs or symptoms? Symptoms of this condition include:  Grey or white vaginal discharge. The discharge can also be watery or foamy.  A fish-like odor with discharge, especially after sexual intercourse or during menstruation.  Itching in and around the vagina.  Burning or pain with urination. Some women with bacterial  vaginosis have no signs or symptoms. How is this diagnosed? This condition is diagnosed based on:  Your medical history.  A physical exam of the vagina.  Testing a sample of vaginal fluid under a microscope to look for a large amount of bad bacteria or abnormal cells. Your health care provider may use a cotton swab or a small wooden spatula to collect the sample. How is this treated? This condition is treated with antibiotics. These may be given as a pill, a vaginal cream, or a medicine that is put into the vagina (suppository). If the condition comes back after treatment, a second round of antibiotics may be needed. Follow these instructions at home: Medicines  Take over-the-counter and prescription medicines only as told by your health care provider.  Take or use your antibiotic as told by your health care provider. Do not stop taking or using the antibiotic even if you start to feel better. General instructions  If you have a female sexual partner, tell her that you have a vaginal infection. She should see her health care provider and be treated if she has symptoms. If you have a female sexual partner, he  does not need treatment.  During treatment:  Avoid sexual activity until you finish treatment.  Do not douche.  Avoid alcohol as directed by your health care provider.  Avoid breastfeeding as directed by your health care provider.  Drink enough water and fluids to keep your urine clear or pale yellow.  Keep the area around your vagina and rectum clean.  Wash the area daily with warm water.  Wipe yourself from front to back after using the toilet.  Keep all follow-up visits as told by your health care provider. This is important. How is this prevented?  Do not douche.  Wash the outside of your vagina with warm water only.  Use protection when having sex. This includes latex condoms and dental dams.  Limit how many sexual partners you have. To help prevent bacterial  vaginosis, it is best to have sex with just one partner (monogamous).  Make sure you and your sexual partner are tested for STIs.  Wear cotton or cotton-lined underwear.  Avoid wearing tight pants and pantyhose, especially during summer.  Limit the amount of alcohol that you drink.  Do not use any products that contain nicotine or tobacco, such as cigarettes and e-cigarettes. If you need help quitting, ask your health care provider.  Do not use illegal drugs. Where to find more information:  Centers for Disease Control and Prevention: SolutionApps.co.zawww.cdc.gov/std  American Sexual Health Association (ASHA): www.ashastd.org  U.S. Department of Health and Health and safety inspectorHuman Services, Office on Women's Health: ConventionalMedicines.siwww.womenshealth.gov/ or http://www.anderson-williamson.info/https://www.womenshealth.gov/a-z-topics/bacterial-vaginosis Contact a health care provider if:  Your symptoms do not improve, even after treatment.  You have more discharge or pain when urinating.  You have a fever.  You have pain in your abdomen.  You have pain during sex.  You have vaginal bleeding between periods. Summary  Bacterial vaginosis is a vaginal infection that occurs when the normal balance of bacteria in the vagina is disrupted.  Because bacterial vaginosis increases your risk for STIs (sexually transmitted infections), getting treated can help reduce your risk for chlamydia, gonorrhea, herpes, and HIV (human immunodeficiency virus). Treatment is also important for preventing complications in pregnant women, because the condition can cause an early (premature) delivery.  This condition is treated with antibiotic medicines. These may be given as a pill, a vaginal cream, or a medicine that is put into the vagina (suppository). This information is not intended to replace advice given to you by your health care provider. Make sure you discuss any questions you have with your health care provider. Document Released: 08/17/2005 Document Revised: 05/02/2016 Document  Reviewed: 05/02/2016 Elsevier Interactive Patient Education  2017 [employer]

## 2016-08-28 NOTE — Progress Notes (Signed)
Low-risk OB appointment [id number] 342w2d Estimated Date of Delivery: 09/09/16 BP (!) 113/58   Pulse 68   Wt 181 lb (82.1 kg)   LMP 12/04/2015 (Exact Date)   BMI 34.20 kg/m   BP, weight, and urine reviewed.  Refer to obstetrical flow sheet for FH & FHR.  Reports good fm.  Denies regular uc's, lof, or uti s/s. Spotting x last few days. Last sex last week. No odor/itching/irritation Spec exam: cx visually closed, mod amt thin frothy pink malodorous d/c. Gc/ct both neg 8d ago Wet prep many wbc's, many clues, no trich seen Rx metronidazole 500mg  BID x 7d for BV, no sex while taking  Reviewed labor s/s, fkc. Plan:  Continue routine obstetrical care  F/U in 1wk for OB appointment

## 2016-08-31 NOTE — L&D Delivery Note (Signed)
26 y.o. [id number] at 3236w1d delivered a viable female infant in cephalic, OA position. There was a nuchal cord x1, that was easily reduced. The anterior shoulder delivered with ease. A 60 sec delayed cord clamping was performed. Cord clamped x2 and cut. Placenta delivered spontaneously intact, with 3VC. Fundus firm on exam with massage and pitocin. Good hemostasis noted.  Anesthesia: Epidural Laceration: Superficial bilateral labial lacerations Suture: No suture repair warranted Good hemostasis noted. EBL: 100 cc  Mom and baby recovering in LDR.    Apgars: APGAR (1 MIN): 8 APGAR (5 MINS):  9 Weight: Pending skin to skin    Gorden HarmsMegan Reka Wist, MD PGY-2 09/17/2016, 8:26 AM

## 2016-09-03 ENCOUNTER — Encounter: Payer: Self-pay | Admitting: Obstetrics and Gynecology

## 2016-09-03 ENCOUNTER — Ambulatory Visit (INDEPENDENT_AMBULATORY_CARE_PROVIDER_SITE_OTHER): Payer: Medicaid Other | Admitting: Obstetrics and Gynecology

## 2016-09-03 VITALS — BP 113/63 | HR 65 | Wt 182.7 lb

## 2016-09-03 DIAGNOSIS — Z1389 Encounter for screening for other disorder: Secondary | ICD-10-CM

## 2016-09-03 DIAGNOSIS — Z331 Pregnant state, incidental: Secondary | ICD-10-CM

## 2016-09-03 DIAGNOSIS — Z3403 Encounter for supervision of normal first pregnancy, third trimester: Secondary | ICD-10-CM

## 2016-09-03 DIAGNOSIS — Z3A39 39 weeks gestation of pregnancy: Secondary | ICD-10-CM

## 2016-09-03 LAB — POCT URINALYSIS DIPSTICK
GLUCOSE UA: NEGATIVE
Ketones, UA: NEGATIVE
Nitrite, UA: NEGATIVE
Protein, UA: NEGATIVE
RBC UA: NEGATIVE

## 2016-09-03 NOTE — Progress Notes (Signed)
[id number]  Estimated Date of Delivery: 09/09/16 [name] 10920w1d          Blood pressure 113/63, pulse 65, weight 182 lb 11.2 oz (82.9 kg), last menstrual period 12/04/2015.   Urine results:notable for 1+ leukocytes, neg protein  Chief Complaint  Patient presents with  . Routine Prenatal Visit    Patient complaints:none. Patient reports   good fetal movement,                           denies any bleeding , rupture of membranes,or regular contractions.   refer to the ob flow sheet for FH and FHR, ,                          Physical Examination: General appearance - alert, well appearing, and in no distress                                      Abdomen - FH 36 ,                                                         -FHR 156                                                         soft, nontender, nondistended, no masses or organomegaly                                      Pelvic - VULVA: normal appearing vulva with no masses, tenderness or lesions, CERVIX: firm long closed high -2-3 vertex                                            Questions were answered. Assessment: [name] [id number] @ 3620w1d , unfavorable cervix.   Plan:  Continued routine obstetrical care,   F/u in 1 weeks for [name]

## 2016-09-11 ENCOUNTER — Encounter: Payer: Self-pay | Admitting: Obstetrics and Gynecology

## 2016-09-11 ENCOUNTER — Ambulatory Visit (INDEPENDENT_AMBULATORY_CARE_PROVIDER_SITE_OTHER): Payer: Medicaid Other | Admitting: Obstetrics and Gynecology

## 2016-09-11 ENCOUNTER — Other Ambulatory Visit (INDEPENDENT_AMBULATORY_CARE_PROVIDER_SITE_OTHER): Payer: Medicaid Other

## 2016-09-11 ENCOUNTER — Other Ambulatory Visit: Payer: Self-pay | Admitting: Obstetrics and Gynecology

## 2016-09-11 VITALS — BP 116/69 | HR 64 | Wt 185.2 lb

## 2016-09-11 DIAGNOSIS — Z3403 Encounter for supervision of normal first pregnancy, third trimester: Secondary | ICD-10-CM

## 2016-09-11 DIAGNOSIS — O358XX1 Maternal care for other (suspected) fetal abnormality and damage, fetus 1: Secondary | ICD-10-CM

## 2016-09-11 DIAGNOSIS — O480 Post-term pregnancy: Secondary | ICD-10-CM

## 2016-09-11 DIAGNOSIS — Z1389 Encounter for screening for other disorder: Secondary | ICD-10-CM

## 2016-09-11 DIAGNOSIS — O0930 Supervision of pregnancy with insufficient antenatal care, unspecified trimester: Secondary | ICD-10-CM

## 2016-09-11 DIAGNOSIS — Z3A41 41 weeks gestation of pregnancy: Secondary | ICD-10-CM | POA: Diagnosis not present

## 2016-09-11 DIAGNOSIS — Z331 Pregnant state, incidental: Secondary | ICD-10-CM

## 2016-09-11 LAB — POCT URINALYSIS DIPSTICK
GLUCOSE UA: NEGATIVE
Ketones, UA: NEGATIVE
NITRITE UA: NEGATIVE
Protein, UA: NEGATIVE
RBC UA: NEGATIVE

## 2016-09-11 NOTE — Progress Notes (Addendum)
Patient ID: Kendra Farmer, female   DOB: 26-Apr-1991, 26 y.o.   MRN: [id number]  [id number]  Estimated Date of Delivery: 09/09/16 [name] [redacted]w[redacted]d Fetal testing : BPP done Blood pressure 116/69, pulse 64, weight 185 lb 3.2 oz (84 kg), last menstrual period 12/04/2015.    Urine results: notable for 2+ leukocytes   Chief Complaint  Patient presents with  . Routine Prenatal Visit   Social JY:NWGN,: the patient has limited support; her fob is from here, and she apparently perceives herself as having no local support to care for infants(3), and she is reluctant to trust anyone to keep children or get them to schoolbus as needed. Pt asked to get relatives from [facility] to come up, ; she's reluctant to commit to that plan.  Patient complaints: none.  Patient reports good fetal movement. She denies any bleeding, rupture of membranes, or regular contractions.   Refer to the ob flow sheet for FH and FHR.    Physical Examination: General appearance - alert, well appearing, and in no distress                                      Abdomen - FH 40 cm,                                                         -FHR 126 bpm                                                         soft, nontender, nondistended, no masses or organomegaly                                      Pelvic - VULVA: normal appearing vulva with no masses, tenderness or lesions,      VAGINA: normal appearing vagina with normal color and discharge, no lesions,      CERVIX: normal appearing cervix without discharge or lesions, closed, long, high, -3                                            Questions were answered. Assessment: [name] [id number] @ [redacted]w[redacted]d GBS-  Cervical unfavofability Social support issues Plan:   1. Continued routine obstetrical care 2. BPP today 8/8 3. IOL scheduled @ 0630 on 09/16/16 of note,: the patient has limited support; her fob is from here, and she apparently perceives herself as having no local support to care for  infants(3), and she is reluctant to trust anyone to keep children or get them to schoolbus as needed. Pt asked to get relatives from [facility] to come up, ; she's reluctant to commit to that plan.  F/u in 09/16/15 for routine prenatal care, NST, confirmation of childcare during delivery  After lengthy discussion and pt reluctance to ask family for help, will schedule NST for next Tuesday, as pt may  not show for her IOL.  If she is not committed to keeping IOL as scheduled witll need to address at f/u visit.  By signing my name below, I, Doreatha MartinEva Mathews, attest that this documentation has been prepared under the direction and in the presence of Tilda BurrowJohn V Jarmel Linhardt, MD. Electronically Signed: Doreatha MartinEva Mathews, ED Scribe. 09/11/16. 9:51 AM.  I personally performed the services described in this documentation, which was SCRIBED in my presence. The recorded information has been reviewed and considered accurate. It has been edited as necessary during review. Tilda BurrowFERGUSON,Puneet Masoner V, MD

## 2016-09-11 NOTE — Progress Notes (Signed)
US 40+2 wks,cephalic,fhr 134 bpm,afi 15.7 cm,post pl gr 3,normal ov's bilat,BPP 8/8,LVEICF 3.2 mm N/C

## 2016-09-14 ENCOUNTER — Telehealth (HOSPITAL_COMMUNITY): Payer: Self-pay | Admitting: *Deleted

## 2016-09-14 NOTE — Telephone Encounter (Signed)
Preadmission screen  

## 2016-09-15 ENCOUNTER — Encounter: Payer: Self-pay | Admitting: Obstetrics & Gynecology

## 2016-09-15 ENCOUNTER — Ambulatory Visit (INDEPENDENT_AMBULATORY_CARE_PROVIDER_SITE_OTHER): Payer: Medicaid Other | Admitting: Obstetrics & Gynecology

## 2016-09-15 VITALS — BP 100/70 | HR 80 | Wt 185.0 lb

## 2016-09-15 DIAGNOSIS — O480 Post-term pregnancy: Secondary | ICD-10-CM | POA: Diagnosis not present

## 2016-09-15 DIAGNOSIS — Z3A41 41 weeks gestation of pregnancy: Secondary | ICD-10-CM

## 2016-09-15 DIAGNOSIS — Z331 Pregnant state, incidental: Secondary | ICD-10-CM

## 2016-09-15 DIAGNOSIS — Z1389 Encounter for screening for other disorder: Secondary | ICD-10-CM

## 2016-09-15 LAB — POCT URINALYSIS DIPSTICK
Blood, UA: NEGATIVE
GLUCOSE UA: NEGATIVE
Ketones, UA: NEGATIVE
Nitrite, UA: NEGATIVE
Protein, UA: NEGATIVE

## 2016-09-15 MED ORDER — FENTANYL CITRATE (PF) 100 MCG/2ML IJ SOLN
100.0000 ug | INTRAMUSCULAR | Status: DC | PRN
  Filled 2016-09-15: qty 2

## 2016-09-15 NOTE — Progress Notes (Signed)
[id number] 7147w6d Estimated Date of Delivery: 09/09/16  Blood pressure 100/70, pulse 80, weight 185 lb (83.9 kg), last menstrual period 12/04/2015.   BP weight and urine results all reviewed and noted.  Please refer to the obstetrical flow sheet for the fundal height and fetal heart rate documentation:  Patient reports good fetal movement, denies any bleeding and no rupture of membranes symptoms or regular contractions. Patient is without complaints. All questions were answered.  Orders Placed This Encounter  Procedures  . POCT urinalysis dipstick    Plan:  Continued routine obstetrical care, reactive NST, to be induced tomorrow @0630   No Follow-up on file.

## 2016-09-16 ENCOUNTER — Encounter (HOSPITAL_COMMUNITY): Payer: Self-pay

## 2016-09-16 ENCOUNTER — Inpatient Hospital Stay (HOSPITAL_COMMUNITY)
Admission: RE | Admit: 2016-09-16 | Discharge: 2016-09-19 | DRG: 767 | Disposition: A | Discharge status: Home / Self Care | Payer: Medicaid Other | Source: Ambulatory Visit | Attending: Obstetrics and Gynecology | Admitting: Obstetrics and Gynecology

## 2016-09-16 DIAGNOSIS — Z302 Encounter for sterilization: Secondary | ICD-10-CM | POA: Diagnosis not present

## 2016-09-16 DIAGNOSIS — O480 Post-term pregnancy: Principal | ICD-10-CM | POA: Diagnosis present

## 2016-09-16 DIAGNOSIS — Z87891 Personal history of nicotine dependence: Secondary | ICD-10-CM

## 2016-09-16 DIAGNOSIS — O0930 Supervision of pregnancy with insufficient antenatal care, unspecified trimester: Secondary | ICD-10-CM

## 2016-09-16 DIAGNOSIS — Z3490 Encounter for supervision of normal pregnancy, unspecified, unspecified trimester: Secondary | ICD-10-CM

## 2016-09-16 DIAGNOSIS — Z3403 Encounter for supervision of normal first pregnancy, third trimester: Secondary | ICD-10-CM

## 2016-09-16 DIAGNOSIS — Z3A41 41 weeks gestation of pregnancy: Secondary | ICD-10-CM | POA: Diagnosis not present

## 2016-09-16 LAB — TYPE AND SCREEN
ABO/RH(D): O POS
Antibody Screen: NEGATIVE

## 2016-09-16 LAB — CBC
HEMATOCRIT: 36.8 % (ref 36.0–46.0)
Hemoglobin: 12.2 g/dL (ref 12.0–15.0)
MCH: 25.8 pg — AB (ref 26.0–34.0)
MCHC: 33.2 g/dL (ref 30.0–36.0)
MCV: 78 fL (ref 78.0–100.0)
Platelets: 235 10*3/uL (ref 150–400)
RBC: 4.72 MIL/uL (ref 3.87–5.11)
RDW: 15.8 % — AB (ref 11.5–15.5)
WBC: 8.2 10*3/uL (ref 4.0–10.5)

## 2016-09-16 LAB — ABO/RH: ABO/RH(D): O POS

## 2016-09-16 MED ORDER — PENICILLIN G POTASSIUM 5000000 UNITS IJ SOLR: 5.0000 10*6.[IU] | Freq: Once | INTRAVENOUS | Status: DC

## 2016-09-16 MED ORDER — TERBUTALINE SULFATE 1 MG/ML IJ SOLN
0.2500 mg | Freq: Once | INTRAMUSCULAR | Status: AC | PRN
  Administered 2016-09-17: 0.25 mg via SUBCUTANEOUS
  Filled 2016-09-16: qty 1

## 2016-09-16 MED ORDER — ONDANSETRON HCL 4 MG/2ML IJ SOLN: 4.0000 mg | Freq: Four times a day (QID) | INTRAMUSCULAR | Status: DC | PRN

## 2016-09-16 MED ORDER — FLEET ENEMA 7-19 GM/118ML RE ENEM: 1.0000 | ENEMA | RECTAL | Status: DC | PRN

## 2016-09-16 MED ORDER — LIDOCAINE HCL (PF) 1 % IJ SOLN: 30.0000 mL | INTRAMUSCULAR | Status: DC | PRN

## 2016-09-16 MED ORDER — LACTATED RINGERS IV SOLN: 500.0000 mL | INTRAVENOUS | Status: DC | PRN

## 2016-09-16 MED ORDER — ACETAMINOPHEN 325 MG PO TABS: 650.0000 mg | ORAL_TABLET | ORAL | Status: DC | PRN

## 2016-09-16 MED ORDER — LACTATED RINGERS IV SOLN
INTRAVENOUS | Status: DC
  Administered 2016-09-16: 09:00:00 via INTRAVENOUS

## 2016-09-16 MED ORDER — LACTATED RINGERS IV SOLN
500.0000 mL | INTRAVENOUS | Status: DC | PRN
  Administered 2016-09-17: 500 mL via INTRAVENOUS

## 2016-09-16 MED ORDER — LIDOCAINE HCL (PF) 1 % IJ SOLN
30.0000 mL | INTRAMUSCULAR | Status: DC | PRN
  Filled 2016-09-16: qty 30

## 2016-09-16 MED ORDER — OXYCODONE-ACETAMINOPHEN 5-325 MG PO TABS: 1.0000 | ORAL_TABLET | ORAL | Status: DC | PRN

## 2016-09-16 MED ORDER — SOD CITRATE-CITRIC ACID 500-334 MG/5ML PO SOLN: 30.0000 mL | ORAL | Status: DC | PRN

## 2016-09-16 MED ORDER — OXYCODONE-ACETAMINOPHEN 5-325 MG PO TABS: 2.0000 | ORAL_TABLET | ORAL | Status: DC | PRN

## 2016-09-16 MED ORDER — SOD CITRATE-CITRIC ACID 500-334 MG/5ML PO SOLN
30.0000 mL | ORAL | Status: DC | PRN
  Filled 2016-09-16: qty 15

## 2016-09-16 MED ORDER — OXYTOCIN 40 UNITS IN LACTATED RINGERS INFUSION - SIMPLE MED: 2.5000 [IU]/h | INTRAVENOUS | Status: DC

## 2016-09-16 MED ORDER — OXYTOCIN BOLUS FROM INFUSION: 500.0000 mL | Freq: Once | INTRAVENOUS | Status: DC

## 2016-09-16 MED ORDER — OXYTOCIN 40 UNITS IN LACTATED RINGERS INFUSION - SIMPLE MED
1.0000 m[IU]/min | INTRAVENOUS | Status: DC
  Administered 2016-09-16: 2 m[IU]/min via INTRAVENOUS
  Filled 2016-09-16: qty 1000

## 2016-09-16 MED ORDER — PENICILLIN G POT IN DEXTROSE 60000 UNIT/ML IV SOLN: 3.0000 10*6.[IU] | INTRAVENOUS | Status: DC

## 2016-09-16 MED ORDER — FENTANYL CITRATE (PF) 100 MCG/2ML IJ SOLN
100.0000 ug | INTRAMUSCULAR | Status: DC | PRN
  Administered 2016-09-16 – 2016-09-17 (×9): 100 ug via INTRAVENOUS
  Filled 2016-09-16 (×9): qty 2

## 2016-09-16 MED ORDER — ACETAMINOPHEN 325 MG PO TABS
650.0000 mg | ORAL_TABLET | ORAL | Status: DC | PRN
Start: 2016-09-16 — End: 2016-09-17

## 2016-09-16 MED ORDER — OXYTOCIN BOLUS FROM INFUSION
500.0000 mL | Freq: Once | INTRAVENOUS | Status: AC
  Administered 2016-09-17: 500 mL via INTRAVENOUS

## 2016-09-16 MED ORDER — LACTATED RINGERS IV SOLN: INTRAVENOUS | Status: DC

## 2016-09-16 NOTE — Progress Notes (Signed)
S: Patient seen & examined for progress of labor. Patient comfortable. She does not want an epidural. She is using IV pain medication. She reports feeling contractions and thinks they are lasting for a long period of time when they come, but then feels as though she has a break before the next one.     O:  Vitals:   09/16/16 1501 09/16/16 1531 09/16/16 1601 09/16/16 1631  BP: (!) 103/53 111/70 (!) 92/50 (!) 105/57  Pulse: 67 66 72 69  Resp: 18 18 18 18   Temp:      TempSrc:      Weight:      Height:        Dilation: 5 Effacement (%): 50 Station: -2 Presentation: Vertex Exam by:: Foye ClockS. Oklesh RN   FHT: 140s bpm, mod var, +accels, no decels TOCO: Irregular pattern   A/P: Continue expectant management Anticipate SVD   Gorden HarmsMegan Linsi Humann, MD PGY-2 09/16/2016 5:58 PM

## 2016-09-16 NOTE — Progress Notes (Signed)
OB Interim Progress Note  S: Evaluated patient for progress of labor.  Having variable decelerations that were previously improving with repositioning.  Patient briefly on non-rebreather and in right lateral decubitus position appearing comfortable.  Pit stopped at 2045.   O: BP 129/74   Pulse (!) 59   Temp 97.8 F (36.6 C) (Oral)   Resp 18   Ht 5\' 1"  (1.549 m)   Wt 83.9 kg (185 lb)   LMP 12/04/2015 (Exact Date)   BMI 34.96 kg/m    Dilation: 5 Effacement (%): 50 Station: -2 Presentation: Vertex Exam by:: Viona GilmoreS Moyer    A/P: IUPC placed without complications and pitocin restarted at 7. Will start amioinfusion with 300cc bolus and then 150cc/hr if continued variable decels.  Continue expectant management Anticipate SVD  Renne Muscaaniel L Damoni Causby, MD 09/16/2016, 9:32 PM PGY-1

## 2016-09-16 NOTE — Progress Notes (Signed)
S: Patient seen & examined for progress of labor. Patient comfortable, feeling some contractions. No epidural at this time. SROM already occurred with clear fluid.     O:  Vitals:   09/16/16 1301 09/16/16 1331 09/16/16 1356 09/16/16 1401  BP: (!) 96/53 109/66  (!) 90/50  Pulse: 68 64  69  Resp: 18     Temp:   97.9 F (36.6 C)   TempSrc:   Oral   Weight:      Height:        Dilation: 3 Effacement (%): 50 Station: -2 Presentation: Vertex Exam by:: Foye ClockS. Oklesh RN   FHT: 130s bpm, mod var, +accels, variable decels TOCO: q 2-3 min   A/P: Continue expectant management Continue with pitocin as tolerated Anticipate SVD   Gorden HarmsMegan Iyana Topor, MD PGY-2 09/16/2016 2:30 PM

## 2016-09-16 NOTE — Anesthesia Pain Management Evaluation Note (Signed)
  CRNA Pain Management Visit Note  Patient: Kendra Farmer, 26 y.o., female  "Hello I am a member of the anesthesia team at [facility]. We have an anesthesia team available at all times to provide care throughout the hospital, including epidural management and anesthesia for C-section. I don't know your plan for the delivery whether it a natural birth, water birth, IV sedation, nitrous supplementation, doula or epidural, but we want to meet your pain goals."   1.Was your pain managed to your expectations on prior hospitalizations?   Yes   2.What is your expectation for pain management during this hospitalization?     IV pain meds  3.How can we help you reach that goal? IV pain meds is patient's plan.  IV pain meds and nitrous have worked well for patient in the past.  Record the patient's initial score and the patient's pain goal.   Pain: 1  Pain Goal: 7 [facility] wants you to be able to say your pain was always managed very well.  Timur Nibert L 09/16/2016

## 2016-09-16 NOTE — Progress Notes (Signed)
Patient seen. Patient having recurrent variable decelerations. Exam unchanged 5/50. SROM earlier today. Improved with repositioning. Continue to monitor. If reoccurs plan for amnio-infusion.

## 2016-09-16 NOTE — H&P (Signed)
LABOR AND DELIVERY ADMISSION HISTORY AND PHYSICAL NOTE  Kendra Farmer is a 26 y.o. female [phone] with IUP at 5429w0d presenting for labor induction for post dates.   She reports positive fetal movement & contrations. She denies leakage of fluid or vaginal bleeding.   Prenatal History/Complications:  Past Medical History: Past Medical History:  Diagnosis Date  . Syphilis 2015    Past Surgical History: Past Surgical History:  Procedure Laterality Date  . APPENDECTOMY      Obstetrical History: OB History    Gravida Para Term Preterm AB Living   4 3 3     3    SAB TAB Ectopic Multiple Live Births           3      Social History: Social History   Social History  . Marital status: Unknown    Spouse name: N/A  . Number of children: N/A  . Years of education: N/A   Social History Main Topics  . Smoking status: Former Games developermoker  . Smokeless tobacco: Never Used  . Alcohol use No  . Drug use: No  . Sexual activity: Yes    Birth control/ protection: None   Other Topics Concern  . None   Social History Narrative  . None    Family History: History reviewed. No pertinent family history.  Allergies: No Known Allergies  Prescriptions Prior to Admission  Medication Sig Dispense Refill Last Dose  . ferrous sulfate 325 (65 FE) MG tablet Take 325 mg by mouth daily with breakfast.   09/14/2016  . Prenatal Vit-Fe Fumarate-FA (MULTIVITAMIN-PRENATAL) 27-0.8 MG TABS tablet Take 1 tablet by mouth daily at 12 noon.   09/14/2016     Review of Systems   All systems reviewed and negative except as stated in HPI  Temperature 98 F (36.7 C), temperature source Oral, height 5\' 1"  (1.549 m), weight 83.9 kg (185 lb), last menstrual period 12/04/2015. General appearance: no distress Lungs: clear to auscultation bilaterally Heart: regular rate and rhythm Abdomen: soft, non-tender; bowel sounds normal Extremities: No calf swelling or tenderness Presentation: cephalic by nursing  exam Fetal monitoring: tocometer Uterine activity: no contractions Dilation: 2 Effacement (%): 50 Station: -2 Exam by:: Foye ClockS. Oklesh RN   Prenatal labs: ABO, Rh: --/--/O POS (01/17 0805) Antibody: PENDING (01/17 0805) Rubella: immune RPR: Non Reactive (10/18 0926)  HBsAg: Negative (08/16 1416)  HIV: Non Reactive (10/18 0926)  GBS: Negative (12/21 1400)  2 hr Glucola: normal Genetic screening:  normal Anatomy US: normal  Prenatal Transfer Tool  Maternal Diabetes: No Genetic Screening: Normal Maternal Ultrasounds/Referrals: Normal Fetal Ultrasounds or other Referrals:  None Maternal Substance Abuse:  No Significant Maternal Medications:  None Significant Maternal Lab Results: Lab values include: Group B Strep negative  Results for orders placed or performed during the hospital encounter of 09/16/16 (from the past 24 hour(s))  CBC   Collection Time: 09/16/16  8:05 AM  Result Value Ref Range   WBC 8.2 4.0 - 10.5 K/uL   RBC 4.72 3.87 - 5.11 MIL/uL   Hemoglobin 12.2 12.0 - 15.0 g/dL   HCT 78.436.8 69.636.0 - 29.546.0 %   MCV 78.0 78.0 - 100.0 fL   MCH 25.8 (L) 26.0 - 34.0 pg   MCHC 33.2 30.0 - 36.0 g/dL   RDW 28.415.8 (H) 13.211.5 - 44.015.5 %   Platelets 235 150 - 400 K/uL  Type and screen [facility]   Collection Time: 09/16/16  8:05 AM  Result Value Ref Range  ABO/RH(D) O POS    Antibody Screen PENDING    Sample Expiration 09/19/2016   Results for orders placed or performed in visit on 09/15/16 (from the past 24 hour(s))  POCT urinalysis dipstick   Collection Time: 09/15/16 10:34 AM  Result Value Ref Range   Color, UA     Clarity, UA     Glucose, UA neg    Bilirubin, UA     Ketones, UA neg    Spec Grav, UA     Blood, UA neg    pH, UA     Protein, UA neg    Urobilinogen, UA     Nitrite, UA neg    Leukocytes, UA Trace (A) Negative    Patient Active Problem List   Diagnosis Date Noted  . Pregnant and not yet delivered 09/16/2016  . Pregnancy 09/16/2016  .  Fetal echogenic intracardiac focus on prenatal ultrasound 05/13/2016  . Supervision of normal pregnancy 04/22/2016  . Late prenatal care 04/22/2016    Assessment: Kendra Farmer is a 26 y.o. G4P3003 at [redacted]w[redacted]d here for labor induction for post dates.  #Labor: IOL for postdates pitocin. Start pitocin #Pain: Plans for IV pain pain medication.  #FWB: Category 1 #ID:  GBS negative #MOF: bottle #MOC:BTL #Circ:  Out patient.   [name], MD [phone] 09/16/2016

## 2016-09-17 ENCOUNTER — Inpatient Hospital Stay (HOSPITAL_COMMUNITY): Payer: Medicaid Other | Admitting: Anesthesiology

## 2016-09-17 ENCOUNTER — Encounter (HOSPITAL_COMMUNITY): Payer: Self-pay

## 2016-09-17 ENCOUNTER — Encounter (HOSPITAL_COMMUNITY)
Admission: RE | Disposition: A | Discharge status: Home / Self Care | Payer: Self-pay | Source: Ambulatory Visit | Attending: Obstetrics and Gynecology

## 2016-09-17 DIAGNOSIS — Z302 Encounter for sterilization: Secondary | ICD-10-CM

## 2016-09-17 DIAGNOSIS — Z3A41 41 weeks gestation of pregnancy: Secondary | ICD-10-CM

## 2016-09-17 HISTORY — PX: TUBAL LIGATION: SHX77

## 2016-09-17 LAB — RPR: RPR Ser Ql: NONREACTIVE

## 2016-09-17 SURGERY — LIGATION, FALLOPIAN TUBE, POSTPARTUM
Anesthesia: Epidural | Site: Abdomen | Laterality: Bilateral | Wound class: Clean Contaminated

## 2016-09-17 MED ORDER — LIDOCAINE-EPINEPHRINE (PF) 2 %-1:200000 IJ SOLN
INTRAMUSCULAR | Status: AC
  Filled 2016-09-17: qty 20

## 2016-09-17 MED ORDER — SODIUM BICARBONATE 8.4 % IV SOLN
INTRAVENOUS | Status: DC | PRN
  Administered 2016-09-17: 5 mL via EPIDURAL
  Administered 2016-09-17: 10 mL via EPIDURAL
  Administered 2016-09-17: 5 mL via EPIDURAL

## 2016-09-17 MED ORDER — FENTANYL 2.5 MCG/ML BUPIVACAINE 1/10 % EPIDURAL INFUSION (WH - ANES)
14.0000 mL/h | INTRAMUSCULAR | Status: DC | PRN
  Administered 2016-09-17: 14 mL/h via EPIDURAL

## 2016-09-17 MED ORDER — PHENYLEPHRINE 40 MCG/ML (10ML) SYRINGE FOR IV PUSH (FOR BLOOD PRESSURE SUPPORT)
PREFILLED_SYRINGE | INTRAVENOUS | Status: AC
  Filled 2016-09-17: qty 20

## 2016-09-17 MED ORDER — METOCLOPRAMIDE HCL 5 MG/ML IJ SOLN: 10.0000 mg | Freq: Once | INTRAMUSCULAR | Status: DC | PRN

## 2016-09-17 MED ORDER — SENNOSIDES-DOCUSATE SODIUM 8.6-50 MG PO TABS
2.0000 | ORAL_TABLET | ORAL | Status: DC
  Administered 2016-09-17: 2 via ORAL
  Filled 2016-09-17 (×2): qty 2

## 2016-09-17 MED ORDER — SODIUM BICARBONATE 8.4 % IV SOLN
INTRAVENOUS | Status: AC
  Filled 2016-09-17: qty 50

## 2016-09-17 MED ORDER — ZOLPIDEM TARTRATE 5 MG PO TABS: 5.0000 mg | ORAL_TABLET | Freq: Every evening | ORAL | Status: DC | PRN

## 2016-09-17 MED ORDER — ONDANSETRON HCL 4 MG PO TABS: 4.0000 mg | ORAL_TABLET | ORAL | Status: DC | PRN

## 2016-09-17 MED ORDER — FENTANYL CITRATE (PF) 100 MCG/2ML IJ SOLN
INTRAMUSCULAR | Status: DC | PRN
  Administered 2016-09-17: 100 ug via INTRAVENOUS

## 2016-09-17 MED ORDER — COCONUT OIL OIL
1.0000 "application " | TOPICAL_OIL | Status: DC | PRN
  Administered 2016-09-19: 1 via TOPICAL
  Filled 2016-09-17: qty 120

## 2016-09-17 MED ORDER — OXYTOCIN 40 UNITS IN LACTATED RINGERS INFUSION - SIMPLE MED
1.0000 m[IU]/min | INTRAVENOUS | Status: DC
  Filled 2016-09-17: qty 1000

## 2016-09-17 MED ORDER — DIPHENHYDRAMINE HCL 50 MG/ML IJ SOLN: 12.5000 mg | INTRAMUSCULAR | Status: DC | PRN

## 2016-09-17 MED ORDER — EPHEDRINE 5 MG/ML INJ: 10.0000 mg | INTRAVENOUS | Status: DC | PRN

## 2016-09-17 MED ORDER — MIDAZOLAM HCL 5 MG/5ML IJ SOLN
INTRAMUSCULAR | Status: DC | PRN
  Administered 2016-09-17: 2 mg via INTRAVENOUS

## 2016-09-17 MED ORDER — PHENYLEPHRINE 40 MCG/ML (10ML) SYRINGE FOR IV PUSH (FOR BLOOD PRESSURE SUPPORT): 80.0000 ug | PREFILLED_SYRINGE | INTRAVENOUS | Status: DC | PRN

## 2016-09-17 MED ORDER — FENTANYL CITRATE (PF) 100 MCG/2ML IJ SOLN: 25.0000 ug | INTRAMUSCULAR | Status: DC | PRN

## 2016-09-17 MED ORDER — PHENYLEPHRINE 40 MCG/ML (10ML) SYRINGE FOR IV PUSH (FOR BLOOD PRESSURE SUPPORT)
80.0000 ug | PREFILLED_SYRINGE | INTRAVENOUS | Status: DC | PRN
  Administered 2016-09-17: 80 ug via INTRAVENOUS

## 2016-09-17 MED ORDER — FENTANYL CITRATE (PF) 100 MCG/2ML IJ SOLN
INTRAMUSCULAR | Status: AC
  Filled 2016-09-17: qty 2

## 2016-09-17 MED ORDER — DIBUCAINE 1 % RE OINT: 1.0000 "application " | TOPICAL_OINTMENT | RECTAL | Status: DC | PRN

## 2016-09-17 MED ORDER — ACETAMINOPHEN 325 MG PO TABS
650.0000 mg | ORAL_TABLET | ORAL | Status: DC | PRN
  Administered 2016-09-17 – 2016-09-19 (×6): 650 mg via ORAL
  Filled 2016-09-17 (×6): qty 2

## 2016-09-17 MED ORDER — PRENATAL MULTIVITAMIN CH
1.0000 | ORAL_TABLET | Freq: Every day | ORAL | Status: DC
  Administered 2016-09-17 – 2016-09-18 (×2): 1 via ORAL
  Filled 2016-09-17 (×2): qty 1

## 2016-09-17 MED ORDER — MIDAZOLAM HCL 2 MG/2ML IJ SOLN
INTRAMUSCULAR | Status: AC
  Filled 2016-09-17: qty 2

## 2016-09-17 MED ORDER — MEPERIDINE HCL 25 MG/ML IJ SOLN: 6.2500 mg | INTRAMUSCULAR | Status: DC | PRN

## 2016-09-17 MED ORDER — IBUPROFEN 600 MG PO TABS
600.0000 mg | ORAL_TABLET | Freq: Four times a day (QID) | ORAL | Status: DC
  Administered 2016-09-17 – 2016-09-19 (×9): 600 mg via ORAL
  Filled 2016-09-17 (×9): qty 1

## 2016-09-17 MED ORDER — BUPIVACAINE HCL (PF) 0.25 % IJ SOLN
INTRAMUSCULAR | Status: DC | PRN
  Administered 2016-09-17: 11 mL

## 2016-09-17 MED ORDER — FENTANYL 2.5 MCG/ML BUPIVACAINE 1/10 % EPIDURAL INFUSION (WH - ANES)
INTRAMUSCULAR | Status: AC
  Filled 2016-09-17: qty 100

## 2016-09-17 MED ORDER — LACTATED RINGERS IV SOLN
INTRAVENOUS | Status: DC | PRN
  Administered 2016-09-17: 09:00:00 via INTRAVENOUS

## 2016-09-17 MED ORDER — SIMETHICONE 80 MG PO CHEW: 80.0000 mg | CHEWABLE_TABLET | ORAL | Status: DC | PRN

## 2016-09-17 MED ORDER — DOCUSATE SODIUM 100 MG PO CAPS
100.0000 mg | ORAL_CAPSULE | Freq: Every day | ORAL | Status: DC
  Administered 2016-09-17 – 2016-09-18 (×2): 100 mg via ORAL
  Filled 2016-09-17 (×3): qty 1

## 2016-09-17 MED ORDER — LACTATED RINGERS IV SOLN
INTRAVENOUS | Status: DC
  Administered 2016-09-17: 300 mL via INTRAUTERINE
  Administered 2016-09-17: 12:00:00 via INTRAUTERINE

## 2016-09-17 MED ORDER — TETANUS-DIPHTH-ACELL PERTUSSIS 5-2.5-18.5 LF-MCG/0.5 IM SUSP: 0.5000 mL | Freq: Once | INTRAMUSCULAR | Status: DC

## 2016-09-17 MED ORDER — LACTATED RINGERS IV SOLN: INTRAVENOUS | Status: DC

## 2016-09-17 MED ORDER — WITCH HAZEL-GLYCERIN EX PADS: 1.0000 "application " | MEDICATED_PAD | CUTANEOUS | Status: DC | PRN

## 2016-09-17 MED ORDER — BUPIVACAINE HCL (PF) 0.25 % IJ SOLN
INTRAMUSCULAR | Status: AC
  Filled 2016-09-17: qty 30

## 2016-09-17 MED ORDER — LIDOCAINE HCL (PF) 1 % IJ SOLN
INTRAMUSCULAR | Status: DC | PRN
  Administered 2016-09-17 (×2): 5 mL

## 2016-09-17 MED ORDER — LACTATED RINGERS IV SOLN: 500.0000 mL | Freq: Once | INTRAVENOUS | Status: DC

## 2016-09-17 MED ORDER — DIPHENHYDRAMINE HCL 25 MG PO CAPS: 25.0000 mg | ORAL_CAPSULE | Freq: Four times a day (QID) | ORAL | Status: DC | PRN

## 2016-09-17 MED ORDER — MEPERIDINE HCL 25 MG/ML IJ SOLN
INTRAMUSCULAR | Status: AC
  Filled 2016-09-17: qty 1

## 2016-09-17 MED ORDER — BENZOCAINE-MENTHOL 20-0.5 % EX AERO
1.0000 | INHALATION_SPRAY | CUTANEOUS | Status: DC | PRN
Start: 2016-09-17 — End: 2016-09-19
  Filled 2016-09-17: qty 56

## 2016-09-17 MED ORDER — MEPERIDINE HCL 25 MG/ML IJ SOLN
INTRAMUSCULAR | Status: DC | PRN
  Administered 2016-09-17: 25 mg via INTRAVENOUS

## 2016-09-17 MED ORDER — OXYCODONE HCL 5 MG PO TABS
5.0000 mg | ORAL_TABLET | ORAL | Status: DC | PRN
  Administered 2016-09-17 – 2016-09-19 (×7): 5 mg via ORAL
  Filled 2016-09-17 (×8): qty 1

## 2016-09-17 MED ORDER — ONDANSETRON HCL 4 MG/2ML IJ SOLN
4.0000 mg | INTRAMUSCULAR | Status: DC | PRN
Start: 2016-09-17 — End: 2016-09-19

## 2016-09-17 MED ORDER — SODIUM CHLORIDE 0.9 % IR SOLN
Status: DC | PRN
  Administered 2016-09-17: 1000 mL

## 2016-09-17 SURGICAL SUPPLY — 24 items
CLIP FILSHIE TUBAL LIGA STRL (Clip) ×3 IMPLANT
CLOTH BEACON ORANGE TIMEOUT ST (SAFETY) ×3 IMPLANT
DERMABOND ADVANCED (GAUZE/BANDAGES/DRESSINGS) ×2
DERMABOND ADVANCED .7 DNX12 (GAUZE/BANDAGES/DRESSINGS) ×1 IMPLANT
DRSG OPSITE POSTOP 3X4 (GAUZE/BANDAGES/DRESSINGS) ×3 IMPLANT
DURAPREP 26ML APPLICATOR (WOUND CARE) ×3 IMPLANT
GLOVE BIO SURGEON STRL SZ7.5 (GLOVE) ×3 IMPLANT
GLOVE BIOGEL PI IND STRL 7.0 (GLOVE) ×2 IMPLANT
GLOVE BIOGEL PI INDICATOR 7.0 (GLOVE) ×4
GOWN STRL REUS W/TWL LRG LVL3 (GOWN DISPOSABLE) ×3 IMPLANT
GOWN STRL REUS W/TWL XL LVL3 (GOWN DISPOSABLE) ×3 IMPLANT
NEEDLE HYPO 22GX1.5 SAFETY (NEEDLE) ×3 IMPLANT
NS IRRIG 1000ML POUR BTL (IV SOLUTION) ×3 IMPLANT
PACK ABDOMINAL MINOR (CUSTOM PROCEDURE TRAY) ×3 IMPLANT
PROTECTOR NERVE ULNAR (MISCELLANEOUS) ×3 IMPLANT
SPONGE LAP 4X18 X RAY DECT (DISPOSABLE) ×3 IMPLANT
SUT MNCRL AB 4-0 PS2 18 (SUTURE) ×3 IMPLANT
SUT PLAIN 0 NONE (SUTURE) ×3 IMPLANT
SUT VIC AB 0 CT1 27 (SUTURE) ×2
SUT VIC AB 0 CT1 27XBRD ANBCTR (SUTURE) ×1 IMPLANT
SYR CONTROL 10ML LL (SYRINGE) ×3 IMPLANT
TOWEL OR 17X24 6PK STRL BLUE (TOWEL DISPOSABLE) ×6 IMPLANT
TRAY FOLEY CATH SILVER 14FR (SET/KITS/TRAYS/PACK) ×3 IMPLANT
WATER STERILE IRR 1000ML POUR (IV SOLUTION) ×3 IMPLANT

## 2016-09-17 NOTE — Progress Notes (Signed)
26 y.o. [id number] with undesired fertility,status post vaginal delivery, desires permanent sterilization. Risks and benefits of procedure discussed with patient including permanence of method, bleeding, infection, injury to surrounding organs and need for additional procedures. Risk failure of 0.5-1% with increased risk of ectopic gestation if pregnancy occurs was also discussed with patient. Patient verbalized understanding and all questions were answered.  Currie ParisJames G. Debroah LoopArnold MD 09/17/2016 9:20 AM

## 2016-09-17 NOTE — Lactation Note (Signed)
This note was copied from a baby's chart. Lactation Consultation Note  Patient Name: Kendra Farmer EXBMW'UToday's Date: 09/17/2016 Reason for consult: Initial assessment Baby at 12 hr of life. Mom is currently bf but plans formula feed at home. She denies breast or nipple pian, voiced no concerns. Discussed baby behavior, feeding frequency, baby belly size, voids, wt loss, breast changes, and nipple care. She stated she can manually express. Given lactation handouts. Aware of OP services and support group. She will call as needed.     Maternal Data Has patient been taught Hand Expression?: Yes  Feeding Feeding Type: Breast Fed Length of feed: 0 min  LATCH Score/Interventions                      Lactation Tools Discussed/Used WIC Program: Yes   Consult Status Consult Status: Follow-up Date: 09/18/16 Follow-up type: In-patient    Rulon Eisenmengerlizabeth E Ayala Ribble 09/17/2016, 8:24 PM

## 2016-09-17 NOTE — Progress Notes (Signed)
CSW acknowledges consult and will meet with MOB when MOB is transferred from L&D unit.   Blaine HamperAngel Boyd-Gilyard, MSW, LCSW Clinical Social Work [phone]

## 2016-09-17 NOTE — Progress Notes (Signed)
OB Interim Progress Note (late entry)  S: Evaluated patient for progress of labor. Appears comfortable. IUPC fell out.   O: BP 118/87   Pulse (!) 110   Temp 97.9 F (36.6 C) (Oral)   Resp 18   Ht 5\' 1"  (1.549 m)   Wt 83.9 kg (185 lb)   LMP 12/04/2015 (Exact Date)   BMI 34.96 kg/m    Dilation: 7 Effacement (%): 80 Cervical Position: Middle Station: -2 Presentation: Vertex Exam by:: S moyer   HFT: HR 155, moderate variability, pos accels, variable decels, contractions 1-1.725min  Chaperone present for exam  A/P: Patient comfortable. IUPC fell out and was replaced without complications. Progressed from 5cm to 7cm dilation and to 80% effacement. Due to 1min contractions and continued variable decels was given terbutaline and contractions have spaced out to 4-6 min and pitocin stopped.  Continue expectant management Anticipate SVD  Renne Muscaaniel L Katai Marsico, MD 09/17/2016, 3:00 AM PGY-1

## 2016-09-17 NOTE — Transfer of Care (Signed)
Immediate Anesthesia Transfer of Care Note  Patient: Kendra Farmer  Procedure(s) Performed: Procedure(s): POST PARTUM TUBAL LIGATION (Bilateral)  Patient Location: PACU  Anesthesia Type:Epidural  Level of Consciousness: awake, alert , oriented and patient cooperative  Airway & Oxygen Therapy: Patient Spontanous Breathing  Post-op Assessment: Report given to RN and Post -op Vital signs reviewed and stable  Post vital signs: Reviewed and stable  Last Vitals:  Vitals:   09/17/16 0846 09/17/16 0901  BP: 115/61 107/61  Pulse: 84 73  Resp: 18 18  Temp:      Last Pain:  Vitals:   09/17/16 0645  TempSrc:   PainSc: 10-Worst pain ever      Patients Stated Pain Goal: 5 (09/17/16 0454)  Complications: No apparent anesthesia complications

## 2016-09-17 NOTE — Op Note (Signed)
Kendra Farmer 09/16/2016 - 09/17/2016  PREOPERATIVE DIAGNOSIS:  Multiparity, undesired fertility  POSTOPERATIVE DIAGNOSIS:  Multiparity, undesired fertility  PROCEDURE:  Postpartum Bilateral Tubal Sterilization using Filshie Clips   SURGEON:   Scheryl DarterJames Arnold, MD - Attending   Jen MowElizabeth Norlan Rann, DO - OB Fellow  ANESTHESIA:  Epidural  COMPLICATIONS:  None immediate.  ESTIMATED BLOOD LOSS:  Less than 20 ml.  FLUIDS: 800 ml LR.  URINE OUTPUT:  1400 ml of clear urine.  INDICATIONS: 26 y.o. [id number]  with undesired fertility,status post vaginal delivery, desires permanent sterilization. Risks and benefits of procedure discussed with patient including permanence of method, bleeding, infection, injury to surrounding organs and need for additional procedures. Risk failure of 0.5-1% with increased risk of ectopic gestation if pregnancy occurs was also discussed with patient.   FINDINGS:  Normal uterus, tubes, and ovaries.  TECHNIQUE:  The patient was taken to the operating room where her epidural anesthesia was dosed up to surgical level and found to be adequate.  She was then placed in the dorsal supine position and prepped and draped in sterile fashion.  After an adequate timeout was performed, attention was turned to the patient's abdomen where a small transverse skin incision was made under the umbilical fold. The incision was taken down to the layer of fascia using the scalpel, and fascia was incised, and extended bilaterally using Mayo scissors. The peritoneum was entered in a sharp fashion. Attention was then turned to the patient's uterus, and left fallopian tube was identified and followed out to the fimbriated end.  A Filshie clip was placed on the left fallopian tube about 2 cm from the cornual attachment, with care given to incorporate the underlying mesosalpinx.  A similar process was carried out on the rightl side allowing for bilateral tubal sterilization.  Good hemostasis was noted overall.   Local analgesia was drizzled on both operative sites.The instruments were then removed from the patient's abdomen and the fascial incision was repaired with 0 Vicryl, and the skin was closed with a 3-0 Monocryl subcuticular stitch. The patient tolerated the procedure well.  Sponge, lap, and needle counts were correct times two.  The patient was then taken to the recovery room awake, extubated and in stable condition.   Kendra ClarksElizabeth W. Henslee Lottman, DO  OB Fellow [facility], [facility] 09/17/2016  11:06 PM

## 2016-09-17 NOTE — Progress Notes (Signed)
Evaluated patient for progress of labor. Appearing comfortable. No complaints. Still at 7cm dilation and 80% effaced and -1 station.  Restarted pitocin.  Will continue to closely monitor and expect SVD.

## 2016-09-17 NOTE — Anesthesia Preprocedure Evaluation (Signed)

## 2016-09-17 NOTE — Progress Notes (Signed)
Patient ID: Kendra Farmer, female   DOB: 08/31/1991, 26 y.o.   MRN: [id number]  Coping well w/ ctx; no epidural. Pit recently stopped due to FHR variables BP 118/79, other VSS FHR 130s, +accels, Cat 1 since Pit stopped Ctx: adequate MVUs spontaneously Cx 7-8cm per Dr Alysia PennaErvin  IUP@term  Protracted active phase FHR variables  Discussion w/ pt by Dr Alysia PennaErvin rec epidural placement to see if relaxing may assist w/ cx dilation. Pt agreeable, but she doesn't want to labor 'all day'.  Told her we will reeval within a couple hours of epidural placement or soon if FHR changes, and make a decision. Hopeful for vag del.  Cam HaiSHAW, Kendra Farmer 09/17/2016 7:43 AM

## 2016-09-17 NOTE — Anesthesia Procedure Notes (Signed)
Epidural Patient location during procedure: OB  Staffing Anesthesiologist: Freda Jaquith Performed: anesthesiologist   Preanesthetic Checklist Completed: patient identified, site marked, surgical consent, pre-op evaluation, timeout performed, IV checked, risks and benefits discussed and monitors and equipment checked  Epidural Patient position: sitting Prep: DuraPrep Patient monitoring: heart rate, continuous pulse ox and blood pressure Approach: right paramedian Location: L3-L4 Injection technique: LOR saline  Needle:  Needle type: Tuohy  Needle gauge: 17 G Needle length: 9 cm and 9 Needle insertion depth: 6 cm Catheter type: closed end flexible Catheter size: 20 Guage Catheter at skin depth: 11 cm Test dose: negative  Assessment Events: blood not aspirated, injection not painful, no injection resistance, negative IV test and no paresthesia  Additional Notes Patient identified. Risks/Benefits/Options discussed with patient including but not limited to bleeding, infection, nerve damage, paralysis, failed block, incomplete pain control, headache, blood pressure changes, nausea, vomiting, reactions to medication both or allergic, itching and postpartum back pain. Confirmed with bedside nurse the patient's most recent platelet count. Confirmed with patient that they are not currently taking any anticoagulation, have any bleeding history or any family history of bleeding disorders. Patient expressed understanding and wished to proceed. All questions were answered. Sterile technique was used throughout the entire procedure. Please see nursing notes for vital signs. Test dose was given through epidural needle and negative prior to continuing to dose epidural or start infusion. Warning signs of high block given to the patient including shortness of breath, tingling/numbness in hands, complete motor block, or any concerning symptoms with instructions to call for help. Patient was given  instructions on fall risk and not to get out of bed. All questions and concerns addressed with instructions to call with any issues.     

## 2016-09-17 NOTE — Anesthesia Postprocedure Evaluation (Addendum)
Anesthesia Post Note  Patient: Kendra Farmer  Procedure(s) Performed: Procedure(s) (LRB): POST PARTUM TUBAL LIGATION (Bilateral)  Patient location during evaluation: Mother Baby Anesthesia Type: Epidural Level of consciousness: awake Pain management: pain level controlled Vital Signs Assessment: post-procedure vital signs reviewed and stable Respiratory status: spontaneous breathing Cardiovascular status: stable Postop Assessment: no headache, no backache, epidural receding and patient able to bend at knees Anesthetic complications: no        Last Vitals:  Vitals:   09/17/16 1130 09/17/16 1147  BP:  (!) 103/53  Pulse: 73 78  Resp: 18 20  Temp: 37.2 C 36.7 C    Last Pain:  Vitals:   09/17/16 1147  TempSrc:   PainSc: 2    Pain Goal: Patients Stated Pain Goal: 5 (09/17/16 0454)               Fanny DanceMULLINS,JANET

## 2016-09-17 NOTE — Anesthesia Postprocedure Evaluation (Signed)
Anesthesia Post Note  Patient: Kendra Farmer  Procedure(s) Performed: * No procedures listed *  Patient location during evaluation: Mother Baby Anesthesia Type: Epidural Level of consciousness: awake Pain management: pain level controlled Vital Signs Assessment: post-procedure vital signs reviewed and stable Respiratory status: spontaneous breathing Cardiovascular status: stable Postop Assessment: no headache, no backache, epidural receding and patient able to bend at knees Anesthetic complications: no        Last Vitals:  Vitals:   09/17/16 1130 09/17/16 1147  BP:  (!) 103/53  Pulse: 73 78  Resp: 18 20  Temp: 37.2 C 36.7 C    Last Pain:  Vitals:   09/17/16 1147  TempSrc:   PainSc: 2    Pain Goal: Patients Stated Pain Goal: 5 (09/17/16 0454)               Fanny DanceMULLINS,[name]

## 2016-09-18 LAB — CBC
HEMATOCRIT: 30.1 % — AB (ref 36.0–46.0)
Hemoglobin: 9.9 g/dL — ABNORMAL LOW (ref 12.0–15.0)
MCH: 25.7 pg — ABNORMAL LOW (ref 26.0–34.0)
MCHC: 32.9 g/dL (ref 30.0–36.0)
MCV: 78.2 fL (ref 78.0–100.0)
PLATELETS: 182 10*3/uL (ref 150–400)
RBC: 3.85 MIL/uL — AB (ref 3.87–5.11)
RDW: 15.9 % — AB (ref 11.5–15.5)
WBC: 8.9 10*3/uL (ref 4.0–10.5)

## 2016-09-18 NOTE — Lactation Note (Signed)
This note was copied from a baby's chart. Lactation Consultation Note  Patient Name: Kendra Farmer [id number]'s Date: 09/18/2016 Reason for consult: Follow-up assessment Baby at 31 hr of life. Mom stated that she has not bf today. She will bf "here and there but that's not really what I want to do". Mom is aware of lactation services and support group. She will call as needed.   Maternal Data    Feeding Feeding Type: Bottle Fed - Formula Nipple Type: Slow - flow  LATCH Score/Interventions                      Lactation Tools Discussed/Used     Consult Status Consult Status: Complete    [name] 09/18/2016, 4:02 PM

## 2016-09-18 NOTE — Progress Notes (Signed)
Post Partum Day 1 Late entry Subjective: up ad lib, voiding, tolerating PO and Bleeding heavier than previous deliveries. Denies dizziness.   Objective: Blood pressure (!) 109/54, pulse (!) 58, temperature 97.9 F (36.6 C), temperature source Oral, resp. rate 16, height 5\' 1"  (1.549 m), weight 185 lb (83.9 kg), last menstrual period 12/04/2015, SpO2 100 %, unknown if currently breastfeeding.  Physical Exam:  General: alert, cooperative, appears stated age and no distress Lochia: appropriate Uterine Fundus: firm Incision: NA DVT Evaluation: Negative Homan's sign.   Recent Labs  09/16/16 0805  HGB 12.2  HCT 36.8    Assessment/Plan: Plan for discharge tomorrow, Social Work consult and Contraception BTL  Bleeding and VS currently stable.  Check CBC   LOS: 2 days   Kendra Farmer 09/18/2016, 9:47 PM

## 2016-09-18 NOTE — Clinical Social Work Maternal (Addendum)
CLINICAL SOCIAL WORK MATERNAL/CHILD NOTE  Patient Details  Name: Kendra Farmer MRN: [id number] Date of Birth: 10/05/1990  Date:  09/18/2016  Clinical Social Worker Initiating Note:  Navdeep Halt, LCSW Date/ Time Initiated:  09/18/16/1430     Child's Name:  Kendra Farmer   Legal Guardian:  Other (Comment) (Parents: Kendra Farmer and Kendra Farmer)   Need for Interpreter:  None   Date of Referral:  09/18/16     Reason for Referral:  Other (Comment) (Concerns with FOB's behavior/lack of support/hx of incarceration for MOB.)   Referral Source:  Central Nursery   Address:  [address] [place], [place] [address]  Phone number:  [phone]   Household Members:  Significant Other, Minor Children (MOB has a 5 year old daughter: Kendra Farmer from a previous relationship.  Couple has two other children at home: Kendra Farmer (2) and Kendra Farmer (1))   Natural Supports (not living in the home):  Immediate Family, Extended Family (MOB reports that her family is supportive and involved, but live in [place])   Professional Supports: None   Employment:     Type of Work: MOB works at [employer].  FOB is a stay at home dad.   Education:      Financial Resources:  Medicaid   Other Resources:      Cultural/Religious Considerations Which May Impact Care: None stated.  Strengths:  Ability to meet basic needs , Pediatrician chosen , Home prepared for child  ([facility].)   Risk Factors/Current Problems:  Other (Comment) (Concern for FOB's behavior)   Cognitive State:  Able to Concentrate , Alert    Mood/Affect:  Calm , Other (Comment) (Nervous)   CSW Assessment: CSW met with MOB in her first floor room/129 to offer support and complete assessment due to staff concerns regarding FOB's inappropriate language and behavior towards MOB, children at home and newborn.  MOB was standing up feeding baby a bottle when CSW arrived.  She seemed timid and nervous, but  welcomed CSW into the room.  There were two men asleep in the room-one in bed and one on couch.  CSW asked who her visitors were and for permission to speak with her now vs returning at a later time.  MOB reports that this is a good time to talk with her and stated that FOB is asleep in her bed.  CSW asked about other visitor and MOB replied that she didn't know who the other man was.  CSW questioned this and she stated that she thinks it is FOB's cousin.  Neither man stirred while CSW was in the room speaking with MOB.  FOB was snoring very loudly. CSW inquired about supports and supplies at home.  MOB states she has all necessary items for baby and is aware of SIDS precautions that were reviewed by CSW.  She states her family is supportive, but do not live locally.  She states she and FOB live together and that he watches the children when she works at [employer].  She is hopeful to be able to return to work in 3 weeks.  MOB states she and FOB have lived in [place] for approximately 1 year and have been in a relationship almost 3 years.  She states they lived in [place] prior to moving to [place].  She states her 26 year old is in school at [employer].  Her children are currently being cared for by a friend while she is in the hospital. CSW informed MOB   of staff's concerns about the way FOB is speaking to her.  MOB laughed nervously and replied, "that's just him.  That's just the way he talks."  She denies any form of abuse.  CSW then suggested that CSW should be asking FOB about how he was overheard speaking to a child over the phone, but noted that he did not seem available to talk with.  CSW asked MOB about comments FOB made, which are documented in a pediatrician note as ""you better f--cking put some clothes on or I'll whip your a**."  MOB told CSW that he was talking to her 5 year old daughter who was wearing a shirt that was too big for her and hanging off her shoulder exposing her nipple.  She states  "he was just telling her to pull it up."  CSW noted that this is concerning to CSW.  MOB attempted to change the subject to concerns she had while in labor and delivery and inquiring about how she can obtain her medical records.  CSW provided her with the phone number to HIM at [facility] so that she can request her records if she chooses.  CSW has been notified by other staff that FOB has appeared drunk and has smelled of alcohol and marijuana throughout hospitalization.  CSW feels having cord drug screen ordered is warranted.   CSW inquired about emotions during pregnancy and after prior deliveries, providing education regarding PMADs.  MOB denies any hx of mental health concerns.   CSW is concerned about reports of FOB's behavior towards MOB, baby and other children in the home as well as suspected drug use.  CSW made report to [place] Child Protective Services.  CSW observed MOB appropriately caring for infant and does not feel infant is in immediate danger, and therefore does not feel discharge should be delayed given report to CPS.  CSW informed CPS intake staff that baby is scheduled to discharge tomorrow.  CSW will follow up with CDS result.  CSW discussed case with pediatrician.  CSW Plan/Description:  Child Protective Service Report , Information/Referral to Community Resources , No Further Intervention Required/No Barriers to Discharge, Patient/Family Education     Diem Dicocco Elizabeth, LCSW 09/18/2016, 4:32 PM 

## 2016-09-19 DIAGNOSIS — Z3A41 41 weeks gestation of pregnancy: Secondary | ICD-10-CM

## 2016-09-19 NOTE — Discharge Summary (Signed)
OB Discharge Summary     Patient Name: Kendra Farmer DOB: Jan 26, 1991 MRN: [id number]  Date of admission: 09/16/2016 Delivering MD: Gorden Harms C   Date of discharge: 09/19/2016  Admitting diagnosis: INDUCTION PP BTL Intrauterine pregnancy: [redacted]w[redacted]d     Secondary diagnosis:  Active Problems:   Pregnancy  Additional problems: none     Discharge diagnosis: Term Pregnancy Delivered                                                                                                Post partum procedures:tubal ligation  Augmentation: Pitocin  Complications: None  Hospital course:  Induction of Labor With Vaginal Delivery   26 y.o. yo [id number] at [redacted]w[redacted]d was admitted to the hospital 09/16/2016 for induction of labor.  Indication for induction: Postdates.  Patient had an uncomplicated labor course as follows: Membrane Rupture Time/Date: 12:05 PM ,09/16/2016   Intrapartum Procedures: Episiotomy: None [1]                                         Lacerations:  None [1]  Patient had delivery of a Viable infant.  Information for the patient's newborn:  Kendra Farmer [[id number]]  Delivery Method: Vag-Spont   09/17/2016  Details of delivery can be found in separate delivery note.  Patient had a routine postpartum course. Patient is discharged home 09/19/16.  Physical exam  Vitals:   09/18/16 0555 09/18/16 1000 09/18/16 1925 09/19/16 0515  BP: 110/61 (!) 115/59 (!) 109/54 121/69  Pulse: 74 (!) 55 (!) 58 67  Resp: 18 17 16 16   Temp: 98.1 F (36.7 C) 97.7 F (36.5 C) 97.9 F (36.6 C) 98.3 F (36.8 C)  TempSrc: Oral Oral Oral Oral  SpO2: 100%     Weight:      Height:       General: alert, cooperative and no distress Lochia: appropriate Uterine Fundus: firm Incision: N/A DVT Evaluation: No evidence of DVT seen on physical exam. Labs: Lab Results  Component Value Date   WBC 8.9 09/18/2016   HGB 9.9 (L) 09/18/2016   HCT 30.1 (L) 09/18/2016   MCV 78.2 09/18/2016   PLT 182  09/18/2016   No flowsheet data found.  Discharge instruction: per After Visit Summary and "Baby and Me Booklet".  After visit meds:  Allergies as of 09/19/2016   No Known Allergies     Medication List    STOP taking these medications   ferrous sulfate 325 (65 FE) MG tablet   multivitamin-prenatal 27-0.8 MG Tabs tablet       Diet: routine diet  Activity: Advance as tolerated. Pelvic rest for 6 weeks.   Outpatient follow up:6 weeks Follow up Appt: Future Appointments Date Time Provider Department Center  10/27/2016 9:00 AM Jacklyn Shell, CNM [facility]   Follow up Visit:No Follow-up on file.  Postpartum contraception: Tubal Ligation  Newborn Data: Live born female  Birth Weight: 7 lb 2.8 oz (3255 g) APGAR: 8, 9  Baby Feeding: Bottle Disposition:home  with mother   09/19/2016 Kendra LandKathryn Lorraine Nitya Farmer, CNM

## 2016-09-19 NOTE — Progress Notes (Signed)
Patient discharged with written instructions. Pt verbalized an understanding. Carmelina DaneERRI L Cerys Winget, RN

## 2016-10-01 ENCOUNTER — Encounter: Payer: Self-pay | Admitting: Advanced Practice Midwife

## 2016-10-02 ENCOUNTER — Encounter: Payer: Self-pay | Admitting: *Deleted

## 2016-10-27 ENCOUNTER — Ambulatory Visit: Payer: Medicaid Other | Admitting: Advanced Practice Midwife

## 2016-11-04 ENCOUNTER — Ambulatory Visit: Payer: Medicaid Other | Admitting: Advanced Practice Midwife

## 2016-11-05 ENCOUNTER — Ambulatory Visit (INDEPENDENT_AMBULATORY_CARE_PROVIDER_SITE_OTHER): Payer: Medicaid Other | Admitting: Advanced Practice Midwife

## 2016-11-05 ENCOUNTER — Encounter: Payer: Self-pay | Admitting: Advanced Practice Midwife

## 2016-11-05 NOTE — Progress Notes (Signed)
Kendra Farmer is a 26 y.o. who presents for a postpartum visit. She is 6 weeks postpartum following a spontaneous vaginal delivery. I have fully reviewed the prenatal and intrapartum course. The delivery was at 41 gestational weeks.  Anesthesia: epidural. Postpartum course has been uneventful. Baby's course has been uneventful. Baby is feeding by breast some and bottle. Bleeding: no bleeding. Bowel function is normal. Bladder function is normal. Patient is sexually active. Contraception method is tubal ligation. Done in the hosptial after delviery Postpartum depression screening: negative.  No current outpatient prescriptions on file.  Review of Systems   Constitutional: Negative for fever and chills Eyes: Negative for visual disturbances Respiratory: Negative for shortness of breath, dyspnea Cardiovascular: Negative for chest pain or palpitations  Gastrointestinal: Negative for vomiting, diarrhea and constipation Genitourinary: Negative for dysuria and urgency Musculoskeletal: Negative for back pain, joint pain, myalgias  Neurological: Negative for dizziness and headaches    Objective:     Vitals:   11/05/16 1346  BP: 104/66  Pulse: 64   General:  alert, cooperative and no distress   Breasts:  negative  Lungs: clear to auscultation bilaterally  Heart:  regular rate and rhythm  Abdomen: Soft, nontender   Vulva:  normal  Vagina: normal vagina  Cervix:  closed  Corpus: Well involuted     Rectal Exam: no hemorrhoids        Assessment:    normal postpartum exam.  Plan:   1. Contraception: tubal ligation 2. Follow up in:   or as needed. May decided to get depo to hep with periods will give it a few months first.

## 2016-11-19 ENCOUNTER — Encounter (HOSPITAL_COMMUNITY): Payer: Self-pay

## 2017-07-01 ENCOUNTER — Other Ambulatory Visit: Payer: Self-pay | Admitting: Adult Health

## 2017-07-22 IMAGING — DX DG HAND COMPLETE 3+V*R*
3 series · 3 of 3 positions shown · non-contrast
Comparison: None.

CLINICAL DATA: Avulsive lacerations to index and middle fingers
using saw at work today. Initial encounter.

EXAM:
RIGHT HAND - COMPLETE 3+ VIEW

[hand pa]
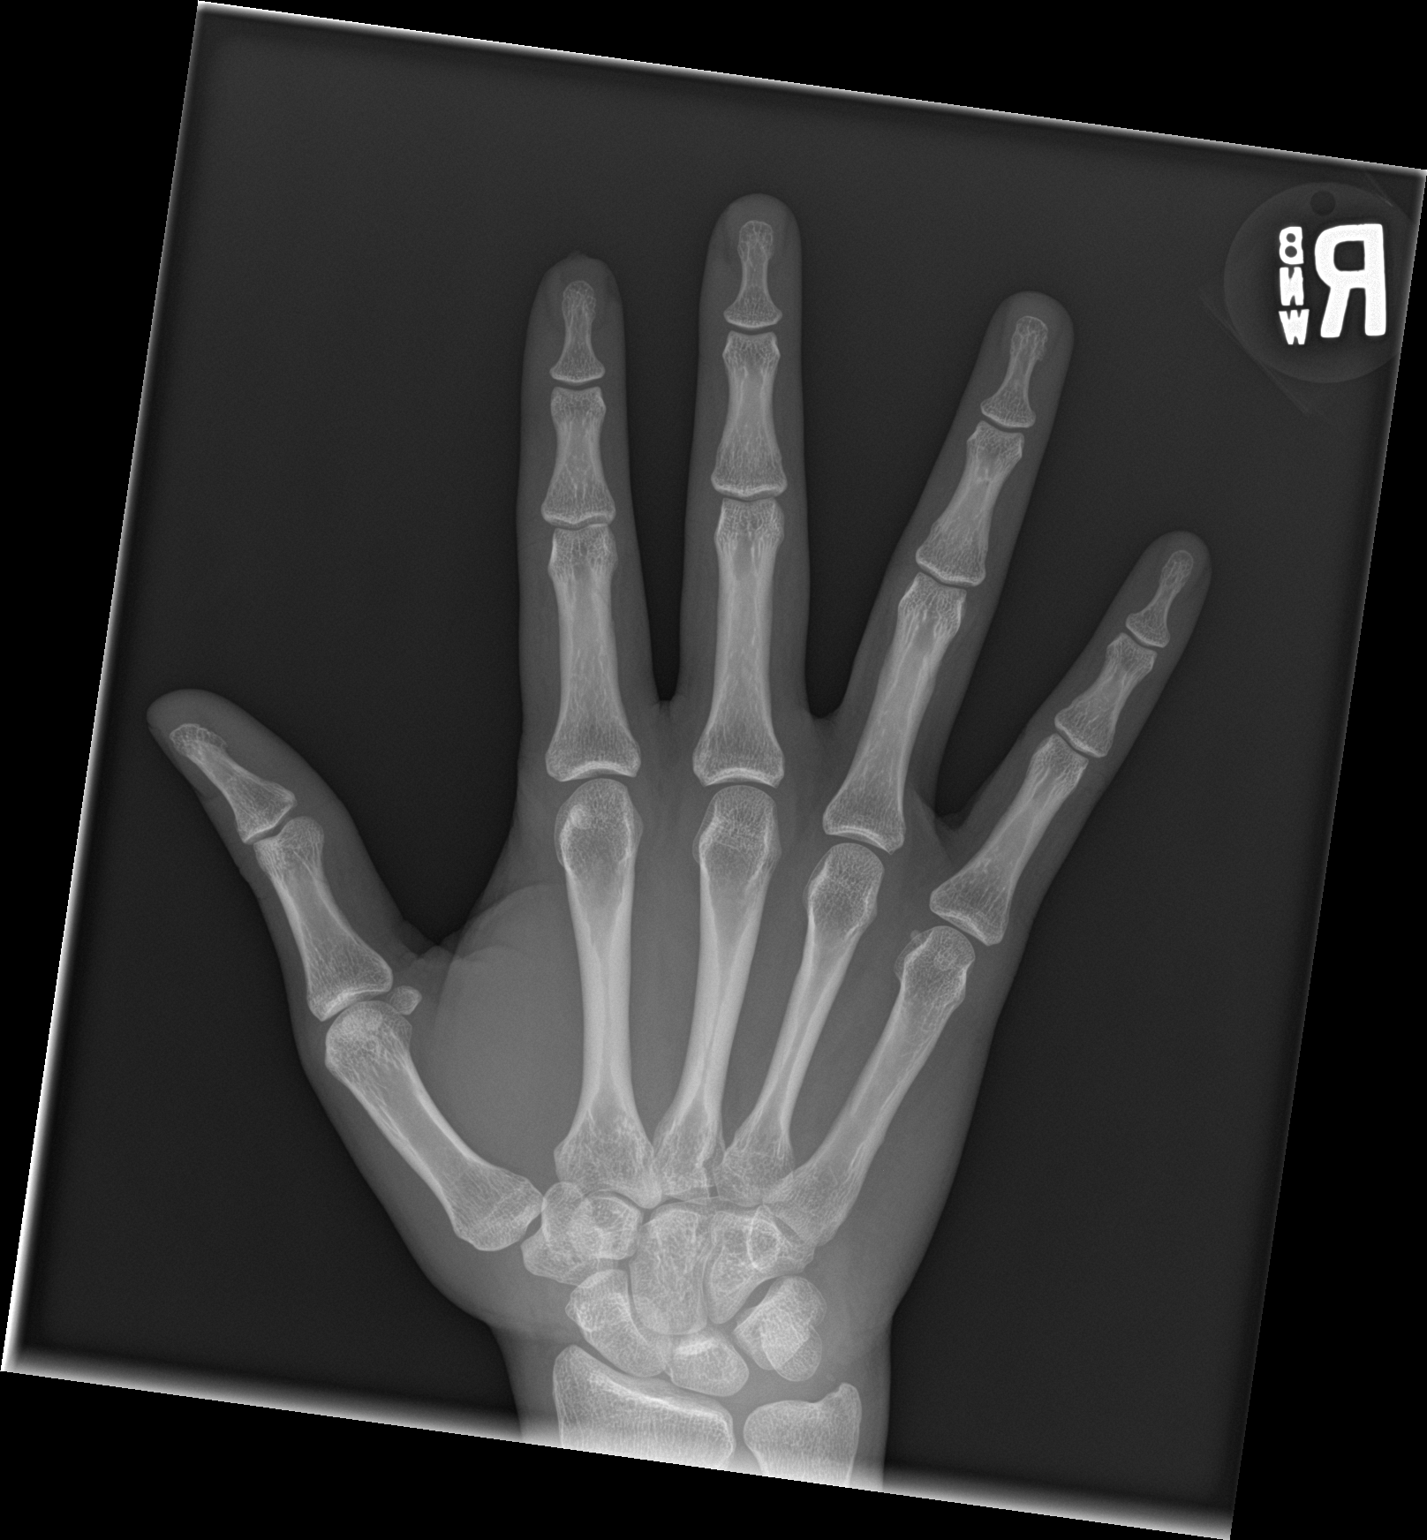

[hand obl]
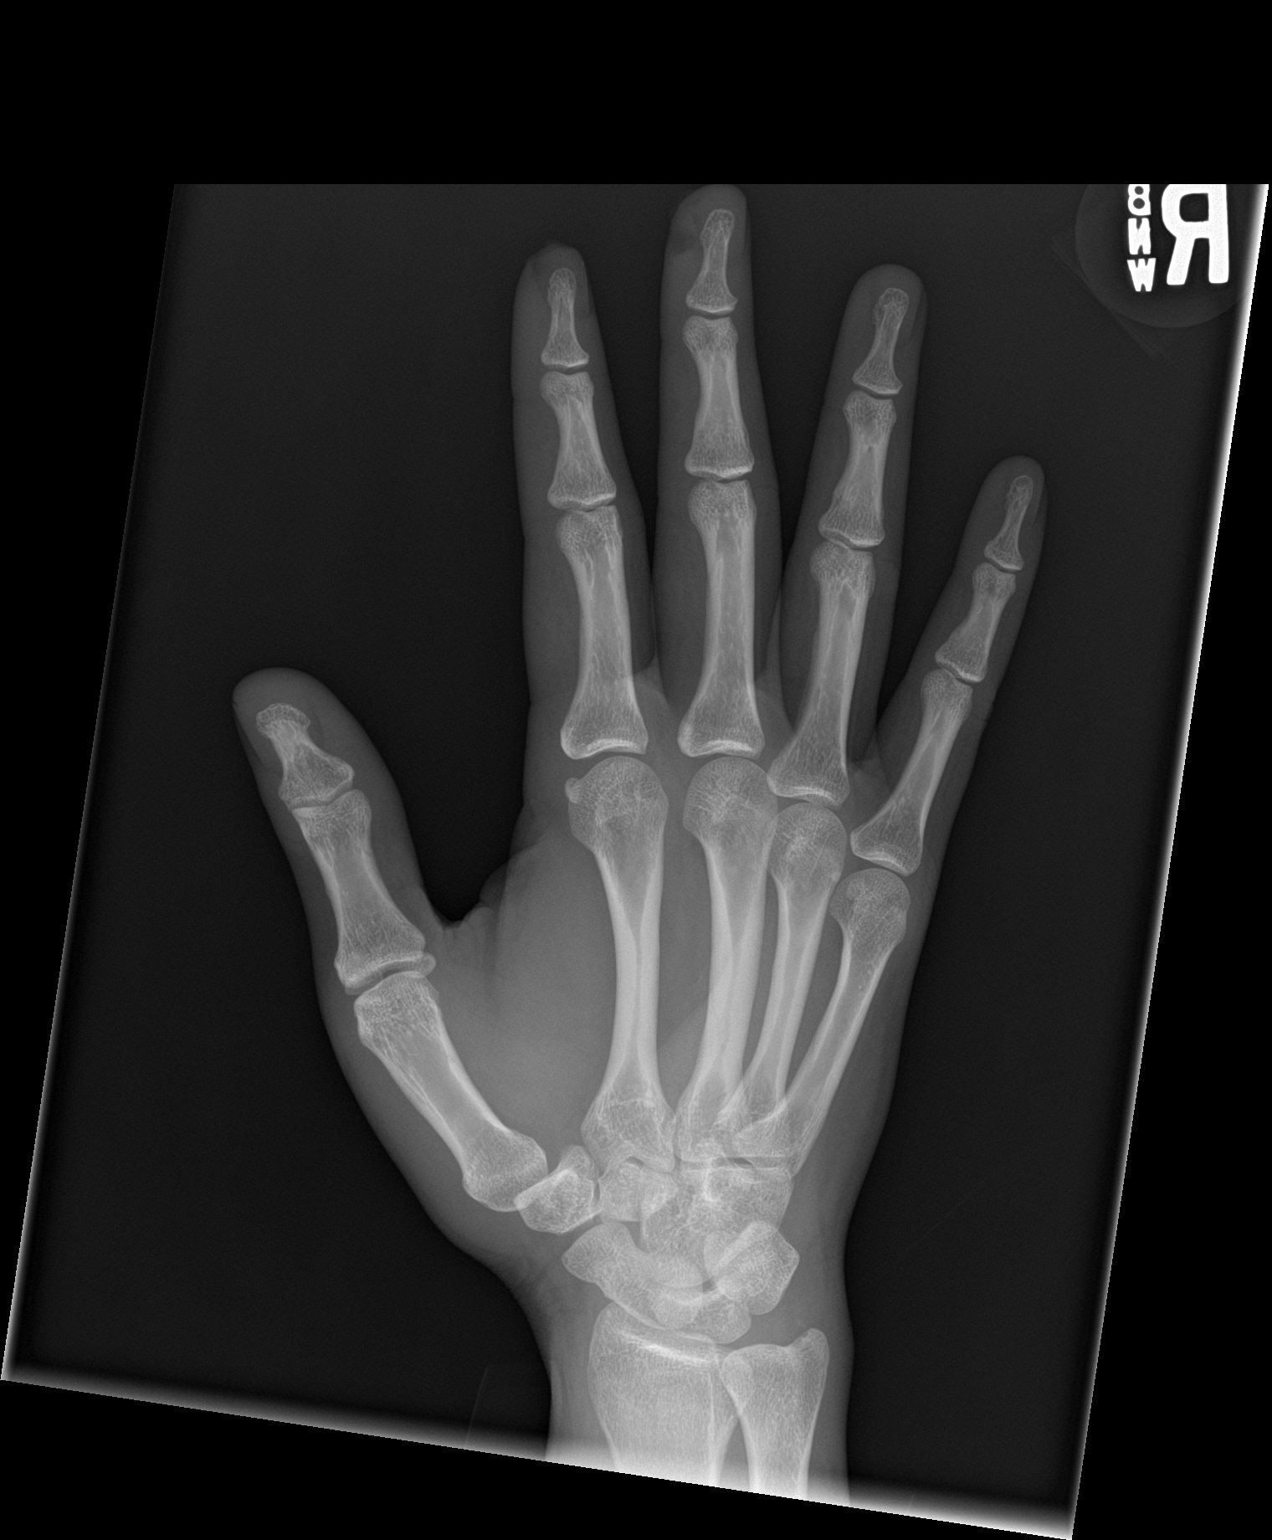

[hand lat]
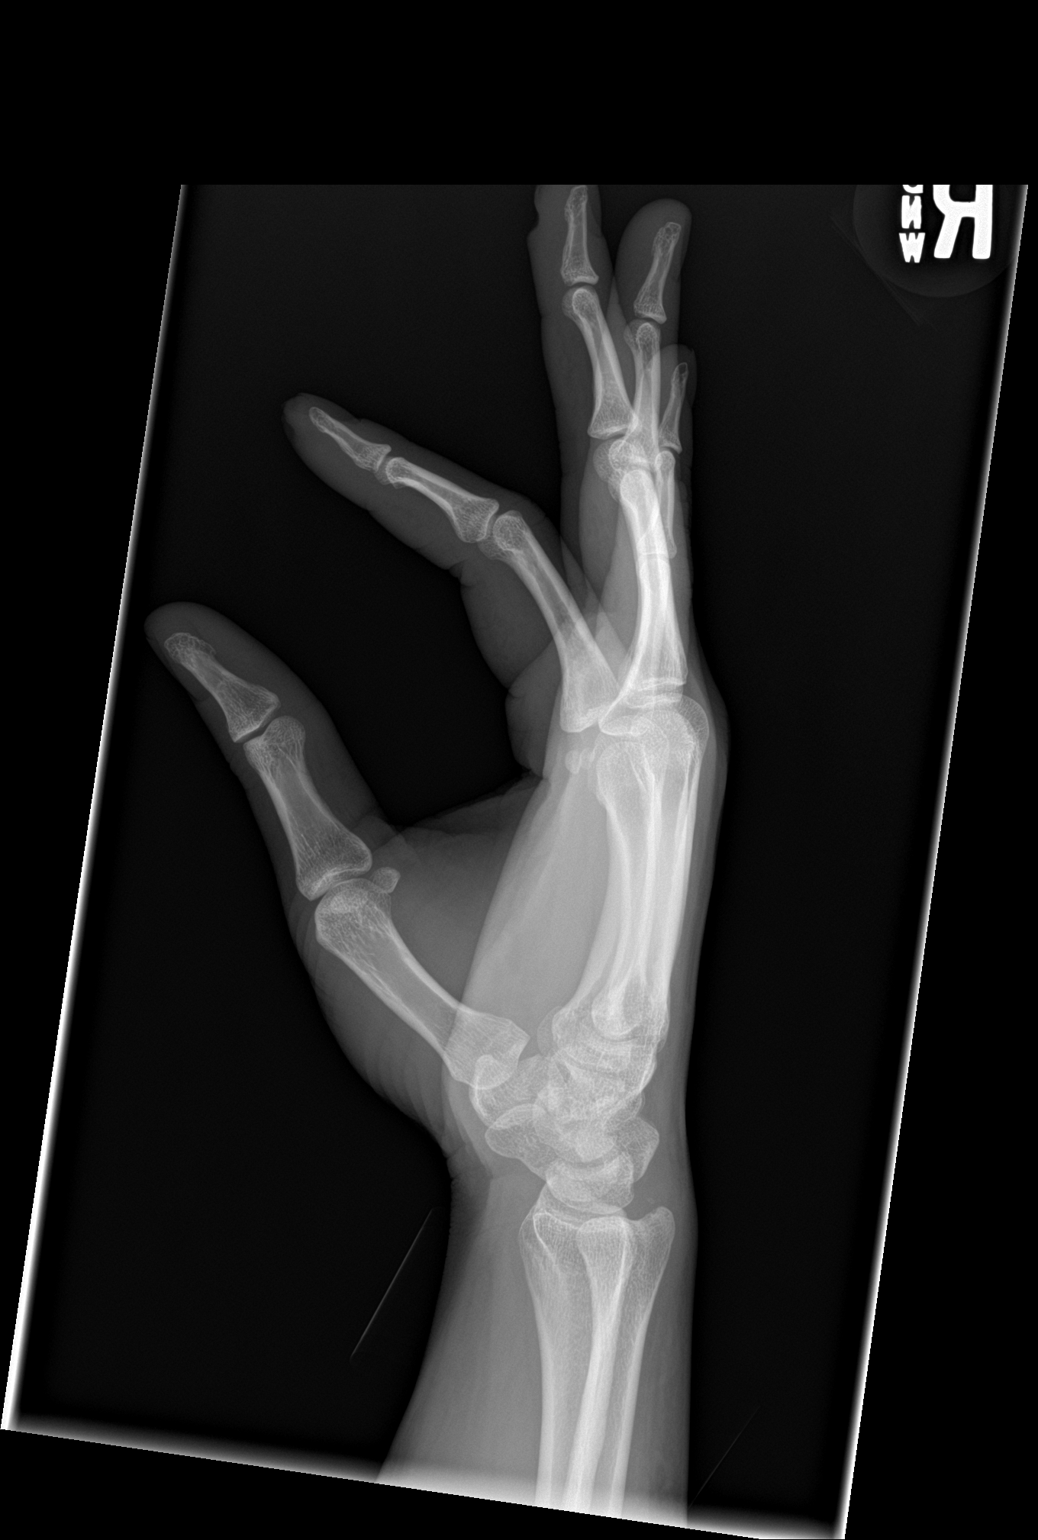

[3 of 3 positions shown; findings below may reference images not displayed]

FINDINGS: There is no evidence of fracture or dislocation. There is no
evidence of arthropathy or other focal bone abnormality. Soft tissue
lacerations are seen involving the distal index and middle fingers,
however there is no evidence of radiopaque foreign body.
IMPRESSION: Soft tissue lacerations involving distal index and middle fingers.
No evidence of fracture or radiopaque foreign body.

## 2017-07-29 ENCOUNTER — Encounter: Payer: Self-pay | Admitting: Adult Health

## 2017-07-29 ENCOUNTER — Other Ambulatory Visit (HOSPITAL_COMMUNITY)
Admission: RE | Admit: 2017-07-29 | Discharge: 2017-07-29 | Disposition: A | Discharge status: Home / Self Care | Payer: Medicaid Other | Source: Ambulatory Visit | Attending: Adult Health | Admitting: Adult Health

## 2017-07-29 ENCOUNTER — Ambulatory Visit: Payer: Medicaid Other | Admitting: Adult Health

## 2017-07-29 VITALS — BP 130/62 | HR 67 | Ht 61.0 in | Wt 165.5 lb

## 2017-07-29 DIAGNOSIS — Z0000 Encounter for general adult medical examination without abnormal findings: Secondary | ICD-10-CM

## 2017-07-29 DIAGNOSIS — Z113 Encounter for screening for infections with a predominantly sexual mode of transmission: Secondary | ICD-10-CM

## 2017-07-29 DIAGNOSIS — Z124 Encounter for screening for malignant neoplasm of cervix: Secondary | ICD-10-CM

## 2017-07-29 DIAGNOSIS — Z01419 Encounter for gynecological examination (general) (routine) without abnormal findings: Secondary | ICD-10-CM | POA: Diagnosis not present

## 2017-07-29 NOTE — Patient Instructions (Signed)
Physical in 1 year 

## 2017-07-29 NOTE — Progress Notes (Signed)
Patient ID: Kendra Farmer, female   DOB: 07/04/1991, 26 y.o.   MRN: [id number] History of Present Illness: Kendra Farmer is a 26 year old black female in for well woman gyn exam and pap.    Current Medications, Allergies, Past Medical History, Past Surgical History, Family History and Social History were reviewed in [facility] electronic medical record.     Review of Systems:  Patient denies any headaches, hearing loss, fatigue, blurred vision, shortness of breath, chest pain, abdominal pain, problems with bowel movements, urination, or intercourse. No joint pain or mood swings.   Physical Exam:BP 130/62 (BP Location: Left Arm, Patient Position: Sitting, Cuff Size: Normal)   Pulse 67   Ht 5\' 1"  (1.549 m)   Wt 165 lb 8 oz (75.1 kg)   LMP 06/29/2017 (Approximate)   Breastfeeding? No   BMI 31.27 kg/m  General:  Well developed, well nourished, no acute distress Skin:  Warm and dry Neck:  Midline trachea, normal thyroid, good ROM, no lymphadenopathy Lungs; Clear to auscultation bilaterally Breast:  No dominant palpable mass, retraction, or nipple discharge Cardiovascular: Regular rate and rhythm Abdomen:  Soft, non tender, no hepatosplenomegaly Pelvic:  External genitalia is normal in appearance, no lesions.  The vagina is normal in appearance. Urethra has no lesions or masses. The cervix is bulbous. Pap with GC/CHL and reflex HPV performed. Uterus is felt to be normal size, shape, and contour.  No adnexal masses or tenderness noted.Bladder is non tender, no masses felt. Extremities/musculoskeletal:  No swelling or varicosities noted, no clubbing or cyanosis Psych:  No mood changes, alert and cooperative,seems happy PHQ 2 score 0.  Impression:  1. Encounter for gynecological examination with Papanicolaou smear of cervix   2. Routine cervical smear   3. Screening examination for STD (sexually transmitted disease)      Plan: Check HIV and RPR Physical in 1 year Pap in 3 if  normal

## 2017-07-30 LAB — CYTOLOGY - PAP
Adequacy: ABSENT
CHLAMYDIA, DNA PROBE: NEGATIVE
DIAGNOSIS: NEGATIVE
NEISSERIA GONORRHEA: NEGATIVE

## 2017-07-30 LAB — HIV ANTIBODY (ROUTINE TESTING W REFLEX): HIV Screen 4th Generation wRfx: NONREACTIVE

## 2017-07-30 LAB — RPR: RPR Ser Ql: NONREACTIVE

## 2018-02-17 ENCOUNTER — Ambulatory Visit: Payer: Medicaid Other | Admitting: Advanced Practice Midwife

## 2018-02-17 ENCOUNTER — Encounter: Payer: Self-pay | Admitting: *Deleted

## 2018-03-02 ENCOUNTER — Encounter: Payer: Self-pay | Admitting: *Deleted

## 2018-03-02 ENCOUNTER — Ambulatory Visit: Payer: Medicaid Other | Admitting: Adult Health

## 2018-03-15 ENCOUNTER — Emergency Department (HOSPITAL_COMMUNITY)
Admission: EM | Admit: 2018-03-15 | Discharge: 2018-03-15 | Disposition: A | Discharge status: Home / Self Care | Payer: Self-pay | Attending: Emergency Medicine | Admitting: Emergency Medicine

## 2018-03-15 ENCOUNTER — Other Ambulatory Visit: Payer: Self-pay

## 2018-03-15 ENCOUNTER — Encounter (HOSPITAL_COMMUNITY): Payer: Self-pay | Admitting: *Deleted

## 2018-03-15 DIAGNOSIS — R319 Hematuria, unspecified: Secondary | ICD-10-CM

## 2018-03-15 DIAGNOSIS — R300 Dysuria: Secondary | ICD-10-CM | POA: Insufficient documentation

## 2018-03-15 DIAGNOSIS — N390 Urinary tract infection, site not specified: Secondary | ICD-10-CM | POA: Insufficient documentation

## 2018-03-15 DIAGNOSIS — Z87891 Personal history of nicotine dependence: Secondary | ICD-10-CM | POA: Insufficient documentation

## 2018-03-15 LAB — URINALYSIS, ROUTINE W REFLEX MICROSCOPIC
Bilirubin Urine: NEGATIVE
Glucose, UA: NEGATIVE mg/dL
Hgb urine dipstick: NEGATIVE
KETONES UR: NEGATIVE mg/dL
Nitrite: NEGATIVE
PH: 6 (ref 5.0–8.0)
PROTEIN: NEGATIVE mg/dL
Specific Gravity, Urine: 1.018 (ref 1.005–1.030)

## 2018-03-15 LAB — PREGNANCY, URINE: PREG TEST UR: NEGATIVE

## 2018-03-15 MED ORDER — CEPHALEXIN 500 MG PO CAPS: 500.0000 mg | ORAL_CAPSULE | Freq: Four times a day (QID) | ORAL | 0 refills | Status: DC

## 2018-03-15 MED ORDER — CEPHALEXIN 500 MG PO CAPS
500.0000 mg | ORAL_CAPSULE | Freq: Once | ORAL | Status: AC
  Administered 2018-03-15: 500 mg via ORAL
  Filled 2018-03-15: qty 1

## 2018-03-15 MED ORDER — PHENAZOPYRIDINE HCL 100 MG PO TABS: 100.0000 mg | ORAL_TABLET | Freq: Three times a day (TID) | ORAL | 0 refills | Status: DC | PRN

## 2018-03-15 NOTE — ED Triage Notes (Signed)
Pt c/o pain with urination and blood in urine x 2 weeks;

## 2018-03-15 NOTE — ED Provider Notes (Signed)
[facility] EMERGENCY DEPARTMENT Provider Note   CSN: [id number] Arrival date & time: 03/15/18  [id number]     History   Chief Complaint Chief Complaint  Patient presents with  . Hematuria    HPI Kendra Farmer is a 27 y.o. female.  Dysuria, burning, frequency, hematuria intermittently x2 weeks.  Last menstrual period 03/01/2018.  Status post BTL.  No fever, sweats, chills, flank pain, vaginal discharge.  Severity of symptoms is mild.  Nothing makes symptoms better or worse.     Past Medical History:  Diagnosis Date  . Syphilis 2015    Patient Active Problem List   Diagnosis Date Noted  . Pregnancy 09/16/2016  . Fetal echogenic intracardiac focus on prenatal ultrasound 05/13/2016  . Supervision of normal pregnancy 04/22/2016  . Late prenatal care 04/22/2016    Past Surgical History:  Procedure Laterality Date  . APPENDECTOMY    . TUBAL LIGATION Bilateral 09/17/2016   Procedure: POST PARTUM TUBAL LIGATION;  Surgeon: Adam PhenixJames G Arnold, MD;  [facility];  Service: Gynecology;  Laterality: Bilateral;     OB History    Gravida  4   Para  4   Term  4   Preterm      AB      Living  4     SAB      TAB      Ectopic      Multiple  0   Live Births  4            Home Medications    Prior to Admission medications   Medication Sig Start Date End Date Taking? Authorizing Provider  cephALEXin (KEFLEX) 500 MG capsule Take 1 capsule (500 mg total) by mouth 4 (four) times daily. 03/15/18   Donnetta Hutchingook, Evonda Enge, MD  phenazopyridine (PYRIDIUM) 100 MG tablet Take 1 tablet (100 mg total) by mouth 3 (three) times daily as needed for pain. 03/15/18   Donnetta Hutchingook, Bonnee Zertuche, MD    Family History History reviewed. No pertinent family history.  Social History Social History   Tobacco Use  . Smoking status: Former Smoker    Years: 1.00    Types: Cigarettes  . Smokeless tobacco: Never Used  Substance Use Topics  . Alcohol use: No  . Drug use: No     Allergies     Patient has no known allergies.   Review of Systems Review of Systems  All other systems reviewed and are negative.    Physical Exam Updated Vital Signs BP (!) 129/91 (BP Location: Left Arm)   Pulse 60   Temp 97.9 F (36.6 C) (Oral)   Resp 18   Ht 5\' 1"  (1.549 m)   Wt 68 kg (150 lb)   LMP 03/01/2018   SpO2 100%   BMI 28.34 kg/m   Physical Exam  Constitutional: She is oriented to person, place, and time. She appears well-developed and well-nourished.  HENT:  Head: Normocephalic and atraumatic.  Eyes: Conjunctivae are normal.  Neck: Neck supple.  Cardiovascular: Normal rate and regular rhythm.  Pulmonary/Chest: Effort normal and breath sounds normal.  Abdominal: Soft. Bowel sounds are normal.  Musculoskeletal: Normal range of motion.  Neurological: She is alert and oriented to person, place, and time.  Skin: Skin is warm and dry.  Psychiatric: She has a normal mood and affect. Her behavior is normal.  Nursing note and vitals reviewed.    ED Treatments / Results  Labs (all labs ordered are listed, but only abnormal results are  displayed) Labs Reviewed  URINALYSIS, ROUTINE W REFLEX MICROSCOPIC - Abnormal; Notable for the following components:      Result Value   APPearance CLOUDY (*)    Leukocytes, UA LARGE (*)    Bacteria, UA RARE (*)    All other components within normal limits  PREGNANCY, URINE    EKG None  Radiology No results found.  Procedures Procedures (including critical care time)  Medications Ordered in ED Medications  cephALEXin (KEFLEX) capsule 500 mg (has no administration in time range)     Initial Impression / Assessment and Plan / ED Course  I have reviewed the triage vital signs and the nursing notes.  Pertinent labs & imaging results that were available during my care of the patient were reviewed by me and considered in my medical decision making (see chart for details).     History and physical consistent with uncomplicated  cystitis.  Urinalysis shows evidence of same.  Will Rx with Keflex 500 mg and Pyridium 100 mg.  Increase fluids.  Final Clinical Impressions(s) / ED Diagnoses   Final diagnoses:  Urinary tract infection with hematuria, site unspecified    ED Discharge Orders        Ordered    cephALEXin (KEFLEX) 500 MG capsule  4 times daily     03/15/18 0955    phenazopyridine (PYRIDIUM) 100 MG tablet  3 times daily PRN    Note to Pharmacy:  Urinary pain   03/15/18 0955       Donnetta Hutching, MD 03/15/18 [phone]

## 2018-03-15 NOTE — Discharge Instructions (Addendum)
Urinalysis shows evidence of infection.  Prescription for antibiotic and medication for urinary pain.  Increase fluids.  Return if worse.

## 2018-03-22 ENCOUNTER — Ambulatory Visit (INDEPENDENT_AMBULATORY_CARE_PROVIDER_SITE_OTHER): Payer: Medicaid Other | Admitting: Adult Health

## 2018-03-22 ENCOUNTER — Encounter: Payer: Self-pay | Admitting: Adult Health

## 2018-03-22 VITALS — BP 116/67 | HR 72 | Ht 61.0 in | Wt 164.5 lb

## 2018-03-22 DIAGNOSIS — N898 Other specified noninflammatory disorders of vagina: Secondary | ICD-10-CM

## 2018-03-22 DIAGNOSIS — A599 Trichomoniasis, unspecified: Secondary | ICD-10-CM | POA: Diagnosis not present

## 2018-03-22 DIAGNOSIS — Z113 Encounter for screening for infections with a predominantly sexual mode of transmission: Secondary | ICD-10-CM

## 2018-03-22 LAB — POCT WET PREP (WET MOUNT)
CLUE CELLS WET PREP WHIFF POC: POSITIVE
WBC, Wet Prep HPF POC: POSITIVE

## 2018-03-22 MED ORDER — METRONIDAZOLE 500 MG PO TABS: 500.0000 mg | ORAL_TABLET | Freq: Two times a day (BID) | ORAL | 0 refills | Status: DC

## 2018-03-22 NOTE — Patient Instructions (Signed)

## 2018-03-22 NOTE — Progress Notes (Signed)
  Subjective:     Patient ID: Kendra Farmer, female   DOB: 03/01/1991, 27 y.o.   MRN: [id number]  HPI Kennith Centerracey is a 27 year old black female in requesting STD testing.She was [facility] 7/[facility] for U[facility]. Had rash on peri area then that has resolved since taking Keflex. Has vaginal odor and discharge when wipes, and spots after sex.   Review of Systems +vaginal odor +spotting after sex +discharge when wipes Reviewed past medical,surgical, social and family history. Reviewed medications and allergies.     Objective:   Physical Exam BP 116/67 (BP Location: Left Arm, Patient Position: Sitting, Cuff Size: Normal)   Pulse 72   Ht 5\' 1"  (1.549 m)   Wt 164 lb 8 oz (74.6 kg)   LMP 03/01/2018   Breastfeeding? No   BMI 31.08 kg/m   Skin warm and dry.Pelvic: external genitalia is normal in appearance no lesions, vagina: frothy discharge with odor,urethra has no lesions or masses noted, cervix:smooth and bulbous, uterus: normal size, shape and contour, non tender, no masses felt, adnexa: no masses or tenderness noted. Bladder is non tender and no masses felt. Wet prep: + for trich and +WBCs. GC/CHL obtained.  Discussed trich and its transmission with her and gave handouts.  Face time 15 minutes with 50% counseling.     Assessment:     1. Trichimoniasis   2. Vaginal discharge   3. Vaginal odor   4. Screening examination for STD (sexually transmitted disease)       Plan:    GC/CHL sent Check HIV and RPR Meds ordered this encounter  Medications  . metroNIDAZOLE (FLAGYL) 500 MG tablet    Sig: Take 1 tablet (500 mg total) by mouth 2 (two) times daily.    Dispense:  14 tablet    Refill:  0    Order Specific Question:   Supervising Provider    Answer:   Lazaro ArmsEURE, LUTHER H [[id number]]  Review handouts on trich No sex or alcohol during treatment Use condoms  Tell partner [place] get treated at PCP or health dept  F/U 04/04/18 for POC

## 2018-03-23 LAB — HIV ANTIBODY (ROUTINE TESTING W REFLEX): HIV Screen 4th Generation wRfx: NONREACTIVE

## 2018-03-23 LAB — RPR: RPR: NONREACTIVE

## 2018-03-24 LAB — GC/CHLAMYDIA PROBE AMP
CHLAMYDIA, DNA PROBE: NEGATIVE
Neisseria gonorrhoeae by PCR: NEGATIVE

## 2018-04-12 ENCOUNTER — Encounter: Payer: Self-pay | Admitting: *Deleted

## 2018-04-12 ENCOUNTER — Ambulatory Visit: Payer: Medicaid Other | Admitting: Adult Health

## 2018-05-18 ENCOUNTER — Ambulatory Visit (INDEPENDENT_AMBULATORY_CARE_PROVIDER_SITE_OTHER): Payer: Medicaid Other | Admitting: Adult Health

## 2018-05-18 ENCOUNTER — Encounter: Payer: Self-pay | Admitting: Adult Health

## 2018-05-18 VITALS — BP 108/62 | HR 64 | Ht 61.0 in | Wt 171.0 lb

## 2018-05-18 DIAGNOSIS — A599 Trichomoniasis, unspecified: Secondary | ICD-10-CM

## 2018-05-18 DIAGNOSIS — Z8619 Personal history of other infectious and parasitic diseases: Secondary | ICD-10-CM | POA: Diagnosis not present

## 2018-05-18 DIAGNOSIS — Z113 Encounter for screening for infections with a predominantly sexual mode of transmission: Secondary | ICD-10-CM | POA: Diagnosis not present

## 2018-05-18 DIAGNOSIS — N760 Acute vaginitis: Secondary | ICD-10-CM | POA: Diagnosis not present

## 2018-05-18 DIAGNOSIS — B9689 Other specified bacterial agents as the cause of diseases classified elsewhere: Secondary | ICD-10-CM

## 2018-05-18 LAB — POCT WET PREP (WET MOUNT)
Clue Cells Wet Prep Whiff POC: POSITIVE
Trichomonas Wet Prep HPF POC: ABSENT
WBC, Wet Prep HPF POC: POSITIVE

## 2018-05-18 MED ORDER — METRONIDAZOLE 500 MG PO TABS: 500.0000 mg | ORAL_TABLET | Freq: Two times a day (BID) | ORAL | 0 refills | Status: DC

## 2018-05-18 NOTE — Progress Notes (Addendum)
  Subjective:     Patient ID: Kendra Farmer, female   DOB: 06/08/1991, 27 y.o.   MRN: [id number]  HPI Kendra Farmer is a 27 year old black female, in for proof of cure for trich, but had to reschedule appt in August.Has had sex with a condom, just noticed slight discharge with odor yesterday.   Review of Systems +Slight discharge for 1 day with odor  Reviewed past medical,surgical, social and family history. Reviewed medications and allergies.     Objective:   Physical Exam BP 108/62 (BP Location: Left Arm, Patient Position: Sitting, Cuff Size: Normal)   Pulse 64   Ht 5\' 1"  (1.549 m)   Wt 171 lb (77.6 kg)   LMP 04/26/2018 (Approximate)   BMI 32.31 kg/m   Skin warm and dry.Pelvic: external genitalia is normal in appearance no lesions, vagina: tan discharge with odor,urethra has no lesions or masses noted, cervix:smooth and bulbous, uterus: normal size, shape and contour, non tender, no masses felt, adnexa: no masses or tenderness noted. Bladder is non tender and no masses felt. Wet prep: + for clue cells and +WBCs. Nuswab obtained.  Examination chaperoned by Marchelle FolksAmanda Rash LPN.    Assessment:     1. BV (bacterial vaginosis)   2. History of trichomoniasis   3. Screening examination for STD (sexually transmitted disease)       Plan:     Nuswab sent Meds ordered this encounter  Medications  . metroNIDAZOLE (FLAGYL) 500 MG tablet    Sig: Take 1 tablet (500 mg total) by mouth 2 (two) times daily.    Dispense:  14 tablet    Refill:  0    Order Specific Question:   Supervising Provider    Answer:   Lazaro ArmsEURE, LUTHER H [[id number]]  Use condoms F/U prn

## 2018-05-20 LAB — NUSWAB VAGINITIS PLUS (VG+)
Atopobium vaginae: HIGH Score — AB
BVAB 2: HIGH {score} — AB
Candida albicans, NAA: NEGATIVE
Candida glabrata, NAA: NEGATIVE
Chlamydia trachomatis, NAA: NEGATIVE
MEGASPHAERA 1: HIGH {score} — AB
Neisseria gonorrhoeae, NAA: NEGATIVE
Trich vag by NAA: NEGATIVE

## 2018-07-14 ENCOUNTER — Ambulatory Visit: Payer: Self-pay | Admitting: Obstetrics & Gynecology

## 2018-07-26 ENCOUNTER — Encounter: Payer: Self-pay | Admitting: Women's Health

## 2018-07-26 ENCOUNTER — Ambulatory Visit (INDEPENDENT_AMBULATORY_CARE_PROVIDER_SITE_OTHER): Payer: Medicaid Other | Admitting: Women's Health

## 2018-07-26 VITALS — BP 109/61 | HR 70 | Ht 61.0 in | Wt 180.0 lb

## 2018-07-26 DIAGNOSIS — Z113 Encounter for screening for infections with a predominantly sexual mode of transmission: Secondary | ICD-10-CM | POA: Diagnosis not present

## 2018-07-26 DIAGNOSIS — N898 Other specified noninflammatory disorders of vagina: Secondary | ICD-10-CM

## 2018-07-26 DIAGNOSIS — A599 Trichomoniasis, unspecified: Secondary | ICD-10-CM

## 2018-07-26 LAB — POCT WET PREP (WET MOUNT): CLUE CELLS WET PREP WHIFF POC: POSITIVE

## 2018-07-26 MED ORDER — METRONIDAZOLE 500 MG PO TABS: 500.0000 mg | ORAL_TABLET | Freq: Two times a day (BID) | ORAL | 0 refills | Status: DC

## 2018-07-26 NOTE — Patient Instructions (Signed)

## 2018-07-26 NOTE — Progress Notes (Signed)
   GYN VISIT Patient name: Kendra Farmer Chai MRN [id number]  Date of birth: 08/28/1991 Chief Complaint:   vaginal irritation and discharge  History of Present Illness:   Kendra Farmer Petraglia is a 27 y.o. [phone] African American female being seen today for report of vaginal discharge and irritation, some odor. Tried OTC yeast cream which didn't help. Had trichomonas in July, was negative in Sept but had BV then. No longer w/ her partner, but has had sex w/ him since last visit .    No LMP recorded. The current method of family planning is tubal ligation. Last pap 07/29/17. Results were:  normal Review of Systems:   Pertinent items are noted in HPI Denies fever/chills, dizziness, headaches, visual disturbances, fatigue, shortness of breath, chest pain, abdominal pain, vomiting, abnormal vaginal discharge/itching/odor/irritation, problems with periods, bowel movements, urination, or intercourse unless otherwise stated above.  Pertinent History Reviewed:  Reviewed past medical,surgical, social, obstetrical and family history.  Reviewed problem list, medications and allergies. Physical Assessment:   Vitals:   07/26/18 0837  BP: 109/61  Pulse: 70  Weight: 180 lb (81.6 kg)  Height: 5\' 1"  (1.549 m)  Body mass index is 34.01 kg/m.       Physical Examination:   General appearance: alert, well appearing, and in no distress  Mental status: alert, oriented to person, place, and time  Skin: warm & dry   Cardiovascular: normal heart rate noted  Respiratory: normal respiratory effort, no distress  Abdomen: soft, non-tender   Pelvic: VULVA: vulvar erythema, VAGINA: normal appearing vagina with normal color and thin yellow malodorous discharge, no lesions, CERVIX: normal appearing cervix without discharge or lesions  Extremities: no edema   Results for orders placed or performed in visit on 07/26/18 (from the past 24 hour(s))  POCT Wet Prep Mellody Drown([facility] [place])   Collection Time: 07/26/18  8:55 AM  Result Value  Ref Range   Source Wet Prep POC vaginal    WBC, Wet Prep HPF POC many    Bacteria Wet Prep HPF POC Few Few   BACTERIA WET PREP MORPHOLOGY POC     Clue Cells Wet Prep HPF POC None None   Clue Cells Wet Prep Whiff POC Positive Whiff    Yeast Wet Prep HPF POC None None   KOH Wet Prep POC     Trichomonas Wet Prep HPF POC Present (A) Absent    Assessment & Plan:  1) Trichomonas> Rx metronidazole 500mg  BID x 7d, no sex or etoh while taking. No longer sexually active w/ anyone. F/U in 3-4wks for POC. Send gc/ct  Meds:  Meds ordered this encounter  Medications  . metroNIDAZOLE (FLAGYL) 500 MG tablet    Sig: Take 1 tablet (500 mg total) by mouth 2 (two) times daily.    Dispense:  14 tablet    Refill:  0    Order Specific Question:   Supervising Provider    Answer:   Duane LopeEURE, LUTHER H [[id number]]    Orders Placed This Encounter  Procedures  . GC/Chlamydia Probe Amp  . POCT Wet Prep [facility] [place])    Return in about 4 weeks (around 08/23/2018) for F/U.  Cheral MarkerKimberly R Booker CNM, [facility]NP-BC 07/26/2018 8:56 AM

## 2018-07-27 LAB — GC/CHLAMYDIA PROBE AMP
CHLAMYDIA, DNA PROBE: NEGATIVE
NEISSERIA GONORRHOEAE BY PCR: NEGATIVE

## 2018-08-22 ENCOUNTER — Ambulatory Visit: Payer: Medicaid Other | Admitting: Women's Health

## 2018-09-06 ENCOUNTER — Ambulatory Visit: Payer: Medicaid Other | Admitting: Women's Health

## 2019-06-16 ENCOUNTER — Other Ambulatory Visit: Payer: Self-pay

## 2019-06-16 ENCOUNTER — Encounter (HOSPITAL_COMMUNITY): Payer: Self-pay | Admitting: Emergency Medicine

## 2019-06-16 ENCOUNTER — Emergency Department (HOSPITAL_COMMUNITY): Payer: Medicaid Other

## 2019-06-16 ENCOUNTER — Emergency Department (HOSPITAL_COMMUNITY)
Admission: EM | Admit: 2019-06-16 | Discharge: 2019-06-16 | Disposition: A | Discharge status: Home / Self Care | Payer: Medicaid Other | Attending: Emergency Medicine | Admitting: Emergency Medicine

## 2019-06-16 DIAGNOSIS — Y929 Unspecified place or not applicable: Secondary | ICD-10-CM | POA: Insufficient documentation

## 2019-06-16 DIAGNOSIS — Y939 Activity, unspecified: Secondary | ICD-10-CM | POA: Insufficient documentation

## 2019-06-16 DIAGNOSIS — Z87891 Personal history of nicotine dependence: Secondary | ICD-10-CM | POA: Insufficient documentation

## 2019-06-16 DIAGNOSIS — S93402A Sprain of unspecified ligament of left ankle, initial encounter: Secondary | ICD-10-CM | POA: Insufficient documentation

## 2019-06-16 DIAGNOSIS — W109XXA Fall (on) (from) unspecified stairs and steps, initial encounter: Secondary | ICD-10-CM | POA: Insufficient documentation

## 2019-06-16 DIAGNOSIS — Y999 Unspecified external cause status: Secondary | ICD-10-CM | POA: Insufficient documentation

## 2019-06-16 MED ORDER — IBUPROFEN 600 MG PO TABS: 600.0000 mg | ORAL_TABLET | Freq: Four times a day (QID) | ORAL | 0 refills | Status: DC | PRN

## 2019-06-16 MED ORDER — OXYCODONE-ACETAMINOPHEN 5-325 MG PO TABS
1.0000 | ORAL_TABLET | Freq: Once | ORAL | Status: AC
  Administered 2019-06-16: 1 via ORAL
  Filled 2019-06-16: qty 1

## 2019-06-16 NOTE — ED Triage Notes (Signed)
PT states she was walking down her exterior stairs and missed a step coming down and her left ankle rolled to the outside and she felt a pop. PT c/o pain with ROM and swelling.

## 2019-06-16 NOTE — ED Provider Notes (Addendum)
[facility] EMERGENCY DEPARTMENT Provider Note   CSN: [id number] Arrival date & time: 06/16/19  0709     History   Chief Complaint Chief Complaint  Patient presents with  . Ankle Injury    HPI Kendra Farmer is a 28 y.o. female presenting with left ankle pain.  Patient states she fell going down steps at 6:40 AM this morning and the process inverted her left ankle.  States she heard a pop and had immediate onset of sharp, aching pain that has been constant since.  Worse with walking and weightbearing patient states she is not able to put weight on her foot at this time.  Denies head injury, loss of consciousness, dizziness or lightheadedness preceding the fall.  States the fall was mechanical and due to skipping a step accident.     HPI  Past Medical History:  Diagnosis Date  . Syphilis 2015    Patient Active Problem List   Diagnosis Date Noted  . Trichomonas infection 07/26/2018    Past Surgical History:  Procedure Laterality Date  . APPENDECTOMY    . TUBAL LIGATION Bilateral 09/17/2016   Procedure: POST PARTUM TUBAL LIGATION;  Surgeon: Woodroe Mode, MD;  Location: [place];  Service: Gynecology;  Laterality: Bilateral;     OB History    Gravida  4   Para  4   Term  4   Preterm      AB      Living  4     SAB      TAB      Ectopic      Multiple  0   Live Births  4            Home Medications    Prior to Admission medications   Medication Sig Start Date End Date Taking? Authorizing Provider  ibuprofen (ADVIL) 600 MG tablet Take 1 tablet (600 mg total) by mouth every 6 (six) hours as needed. 06/16/19   Tedd Sias, PA  metroNIDAZOLE (FLAGYL) 500 MG tablet Take 1 tablet (500 mg total) by mouth 2 (two) times daily. 07/26/18   Roma Schanz, CNM    Family History History reviewed. No pertinent family history.  Social History Social History   Tobacco Use  . Smoking status: Former Smoker    Years: 1.00    Types:  Cigarettes  . Smokeless tobacco: Never Used  Substance Use Topics  . Alcohol use: No  . Drug use: No     Allergies   Patient has no known allergies.   Review of Systems Review of Systems  Constitutional: Negative for fever.  Respiratory: Negative for shortness of breath.   Cardiovascular: Negative for chest pain.  Musculoskeletal: Positive for gait problem.  Skin: Negative for wound.     Physical Exam Updated Vital Signs BP 118/82 (BP Location: Right Arm)   Pulse 85   Temp 98.4 F (36.9 C) (Oral)   Resp 18   Ht 5\' 1"  (1.549 m)   Wt 81.6 kg   LMP 05/24/2019   SpO2 100%   BMI 34.01 kg/m   Physical Exam Vitals signs and nursing note reviewed.  Constitutional:      Appearance: Normal appearance. She is not ill-appearing.     Comments: Patient appears uncomfortable.  Unable to stand.  HENT:     Head: Normocephalic and atraumatic.  Eyes:     General: No scleral icterus.       Right eye: No discharge.  Left eye: No discharge.     Conjunctiva/sclera: Conjunctivae normal.  Cardiovascular:     Rate and Rhythm: Normal rate.     Pulses: Normal pulses.     Comments: Pulses palpable DP PT bilaterally Pulmonary:     Effort: Pulmonary effort is normal.     Breath sounds: No stridor.  Musculoskeletal:     Comments: Left ankle with swelling, no bruising, no deformity. Tenderness over the posterior fibula and lateral malleolus.  No medial malleoli or tenderness.  No navicular or fifth metacarpal tenderness. Joint does not demonstrate unusual ligamentous laxity with stress.   Unable to assess gait as patient is unable to weight-bear secondary to pain.  Neurological:     Mental Status: She is alert and oriented to person, place, and time. Mental status is at baseline.     Comments: Sensation intact bilateral lower extremities      ED Treatments / Results  Labs (all labs ordered are listed, but only abnormal results are displayed) Labs Reviewed - No data to  display  EKG None  Radiology Dg Ankle Complete Left  Result Date: 06/16/2019 CLINICAL DATA:  Pain following fall EXAM: LEFT ANKLE COMPLETE - 3+ VIEW COMPARISON:  None. FINDINGS: Frontal, oblique, and lateral views were obtained. There is mild generalized soft tissue swelling. No evident fracture or joint effusion. There is no appreciable joint space narrowing or erosion. The ankle mortise appears intact. IMPRESSION: Mild generalized soft tissue swelling. No evident fracture or arthropathy. Ankle mortise appears intact. Electronically Signed   By: Bretta Bang III M.D.   On: 06/16/2019 08:07    Procedures Procedures (including critical care time)  Medications Ordered in ED Medications  oxyCODONE-acetaminophen (PERCOCET/ROXICET) 5-325 MG per tablet 1 tablet (has no administration in time range)     Initial Impression / Assessment and Plan / ED Course  I have reviewed the triage vital signs and the nursing notes.  Pertinent labs & imaging results that were available during my care of the patient were reviewed by me and considered in my medical decision making (see chart for details).        Patient with ankle injury this morning. Xray of ankle negative for malleolar fracture. No navicular or 5th metacarpal tenderness. Patient able to ambulate in ED. Offered crutches and walking boot and will discharge home with instructions to use Tylenol and ibuprofen for pain.  Will recommend follow-up with orthopedics in 5 days if needed for reassessment.     Final Clinical Impressions(s) / ED Diagnoses   Final diagnoses:  Sprain of left ankle, unspecified ligament, initial encounter    ED Discharge Orders         Ordered    ibuprofen (ADVIL) 600 MG tablet  Every 6 hours PRN     06/16/19 0813            Gailen Shelter, PA 06/16/19 0820    Cathren Laine, MD 06/16/19 1534

## 2019-06-16 NOTE — Discharge Instructions (Addendum)
Is use Tylenol ibuprofen as discussed for pain. Ibuprofen 600mg  three times a day for the next 5 days. Use R.I.C.E. (rest, ice, compression and elevation). Ice should be applied immediately to help keep swelling down for 20-30 minutes up to four times daily. The ankle should be elevated above the chest for 48 hours. Rest your ankle and try not to walk on it. Use compression dressings and wraps to immobilize and support the ankle.

## 2020-03-28 ENCOUNTER — Other Ambulatory Visit (HOSPITAL_COMMUNITY)
Admission: RE | Admit: 2020-03-28 | Discharge: 2020-03-28 | Disposition: A | Discharge status: Home / Self Care | Payer: Medicaid Other | Source: Ambulatory Visit | Attending: Obstetrics and Gynecology | Admitting: Obstetrics and Gynecology

## 2020-03-28 ENCOUNTER — Other Ambulatory Visit (INDEPENDENT_AMBULATORY_CARE_PROVIDER_SITE_OTHER): Payer: Medicaid Other | Admitting: *Deleted

## 2020-03-28 DIAGNOSIS — N898 Other specified noninflammatory disorders of vagina: Secondary | ICD-10-CM

## 2020-03-28 DIAGNOSIS — Z113 Encounter for screening for infections with a predominantly sexual mode of transmission: Secondary | ICD-10-CM

## 2020-03-28 NOTE — Progress Notes (Signed)
Chart reviewed for nurse visit. Agree with plan of care.  Adline Potter, NP 03/28/2020 1:24 PM

## 2020-03-28 NOTE — Progress Notes (Signed)
   NURSE VISIT- STD  SUBJECTIVE:  Kendra Farmer is a 29 y.o. [id number] GYN patientfemale here for a vaginal swab for STD screen.  She reports the following symptoms: discharge described as creamy for 2 weeks. Denies abnormal vaginal bleeding, significant pelvic pain, fever, or UTI symptoms.  OBJECTIVE:  There were no vitals taken for this visit.  Appears well, in no apparent distress  ASSESSMENT: Vaginal swab for STD screen  PLAN: Self-collected vaginal probe for Gonorrhea, Chlamydia, Trichomonas sent to lab Treatment: to be determined once results are received Follow-up as needed if symptoms persist/worsen, or new symptoms develop  [name]  03/28/2020 9:22 AM

## 2020-03-29 LAB — CERVICOVAGINAL ANCILLARY ONLY
Chlamydia: NEGATIVE
Comment: NEGATIVE
Comment: NEGATIVE
Comment: NORMAL
Neisseria Gonorrhea: NEGATIVE
Trichomonas: POSITIVE — AB

## 2020-04-01 ENCOUNTER — Telehealth: Payer: Self-pay | Admitting: Adult Health

## 2020-04-01 DIAGNOSIS — A599 Trichomoniasis, unspecified: Secondary | ICD-10-CM

## 2020-04-01 MED ORDER — METRONIDAZOLE 500 MG PO TABS: 500.0000 mg | ORAL_TABLET | Freq: Two times a day (BID) | ORAL | 0 refills | Status: DC

## 2020-04-01 NOTE — Telephone Encounter (Signed)
rx flagyl for +trich on CV swab

## 2020-04-01 NOTE — Addendum Note (Signed)
Addended by: Cyril Mourning A on: 04/01/2020 03:06 PM   Modules accepted: Orders

## 2020-04-01 NOTE — Telephone Encounter (Signed)
Pt lab showed positive for trichomonas on 03/28/20 would like medicine sent to pharmacy [facility] in [place].

## 2020-04-02 ENCOUNTER — Ambulatory Visit (INDEPENDENT_AMBULATORY_CARE_PROVIDER_SITE_OTHER): Payer: Self-pay | Admitting: Adult Health

## 2020-04-02 ENCOUNTER — Encounter: Payer: Self-pay | Admitting: Adult Health

## 2020-04-02 VITALS — BP 118/72 | HR 58 | Ht 61.0 in | Wt 161.0 lb

## 2020-04-02 DIAGNOSIS — A599 Trichomoniasis, unspecified: Secondary | ICD-10-CM

## 2020-04-02 NOTE — Progress Notes (Signed)
  Subjective:     Patient ID: Kendra Farmer, female   DOB: November 25, 1990, 29 y.o.   MRN: [id number]  HPI Kendra Farmer is a 29 year old black female,single, G4P4 sp tubal, in wanting to ask questions about PID, is currently being treated for trich.   Review of Systems  Taking meds for trich Denies any pain or bad periods,bu had questions about PID  Reviewed past medical,surgical, social and family history. Reviewed medications and allergies.     Objective:   Physical Exam BP 118/72 (BP Location: Left Arm, Patient Position: Sitting, Cuff Size: Normal)   Pulse (!) 58   Ht 5\' 1"  (1.549 m)   Wt 161 lb (73 kg)   LMP 03/19/2020 (Exact Date)   BMI 30.42 kg/m  Skin warm and dry. Lungs: clear to ausculation bilaterally. Cardiovascular: regular rate and rhythm.     Upstream - 04/02/20 0953      Pregnancy Intention Screening   Does the patient want to become pregnant in the next year? No    Does the patient's partner want to become pregnant in the next year? No    Would the patient like to discuss contraceptive options today? No      Contraception Wrap Up   Current Method Female Sterilization    End Method Female Sterilization    Contraception Counseling Provided No         Answered any questions regarding PID   Assessment:     1. Trichomonas infection Finish flagyl No sex  Return in 2 weeks for self swab for POT for trich    Plan:     Pap and physical in November

## 2020-04-16 ENCOUNTER — Other Ambulatory Visit: Payer: Medicaid Other

## 2020-05-28 ENCOUNTER — Other Ambulatory Visit (HOSPITAL_COMMUNITY)
Admission: RE | Admit: 2020-05-28 | Discharge: 2020-05-28 | Disposition: A | Discharge status: Home / Self Care | Payer: Medicaid Other | Source: Ambulatory Visit | Attending: Obstetrics & Gynecology | Admitting: Obstetrics & Gynecology

## 2020-05-28 ENCOUNTER — Encounter: Payer: Self-pay | Admitting: Obstetrics & Gynecology

## 2020-05-28 ENCOUNTER — Ambulatory Visit (INDEPENDENT_AMBULATORY_CARE_PROVIDER_SITE_OTHER): Payer: Medicaid Other | Admitting: Obstetrics & Gynecology

## 2020-05-28 VITALS — BP 103/66 | HR 55 | Ht 61.0 in | Wt 157.0 lb

## 2020-05-28 DIAGNOSIS — Z01419 Encounter for gynecological examination (general) (routine) without abnormal findings: Secondary | ICD-10-CM

## 2020-05-28 NOTE — Progress Notes (Signed)
Subjective:     Kendra Farmer is a 29 y.o. female here for a routine exam.  Patient's last menstrual period was 05/15/2020. [id number] Birth Control Method:  BTL 2018 Menstrual Calendar(currently): regular  Current complaints: none.   Current acute medical issues:  none   Recent Gynecologic History Patient's last menstrual period was 05/15/2020. Last Pap: 2018,  normal Last mammogram: n/a,    Past Medical History:  Diagnosis Date  . Syphilis 2015    Past Surgical History:  Procedure Laterality Date  . APPENDECTOMY    . TUBAL LIGATION Bilateral 09/17/2016   Procedure: POST PARTUM TUBAL LIGATION;  Surgeon: Woodroe Mode, MD;  Location: [place];  Service: Gynecology;  Laterality: Bilateral;    OB History    Gravida  4   Para  4   Term  4   Preterm      AB      Living  4     SAB      TAB      Ectopic      Multiple  0   Live Births  4           Social History   Socioeconomic History  . Marital status: Single    Spouse name: Not on file  . Number of children: Not on file  . Years of education: Not on file  . Highest education level: Not on file  Occupational History  . Not on file  Tobacco Use  . Smoking status: Former Smoker    Years: 1.00    Types: Cigarettes  . Smokeless tobacco: Never Used  Vaping Use  . Vaping Use: Never used  Substance and Sexual Activity  . Alcohol use: No  . Drug use: No  . Sexual activity: Not Currently    Birth control/protection: Surgical    Comment: tubal  Other Topics Concern  . Not on file  Social History Narrative  . Not on file   Social Determinants of Health   Financial Resource Strain:   . Difficulty of Paying Living Expenses: Not on file  Food Insecurity:   . Worried About Charity fundraiser in the Last Year: Not on file  . Ran Out of Food in the Last Year: Not on file  Transportation Needs:   . Lack of Transportation (Medical): Not on file  . Lack of Transportation (Non-Medical): Not  on file  Physical Activity:   . Days of Exercise per Week: Not on file  . Minutes of Exercise per Session: Not on file  Stress:   . Feeling of Stress : Not on file  Social Connections:   . Frequency of Communication with Friends and Family: Not on file  . Frequency of Social Gatherings with Friends and Family: Not on file  . Attends Religious Services: Not on file  . Active Member of Clubs or Organizations: Not on file  . Attends Archivist Meetings: Not on file  . Marital Status: Not on file    History reviewed. No pertinent family history.  No current outpatient medications on file.  Review of Systems  Review of Systems  Constitutional: Negative for fever, chills, weight loss, malaise/fatigue and diaphoresis.  HENT: Negative for hearing loss, ear pain, nosebleeds, congestion, sore throat, neck pain, tinnitus and ear discharge.   Eyes: Negative for blurred vision, double vision, photophobia, pain, discharge and redness.  Respiratory: Negative for cough, hemoptysis, sputum production, shortness of breath, wheezing and stridor.   Cardiovascular: Negative  for chest pain, palpitations, orthopnea, claudication, leg swelling and PND.  Gastrointestinal: negative for abdominal pain. Negative for heartburn, nausea, vomiting, diarrhea, constipation, blood in stool and melena.  Genitourinary: Negative for dysuria, urgency, frequency, hematuria and flank pain.  Musculoskeletal: Negative for myalgias, back pain, joint pain and falls.  Skin: Negative for itching and rash.  Neurological: Negative for dizziness, tingling, tremors, sensory change, speech change, focal weakness, seizures, loss of consciousness, weakness and headaches.  Endo/Heme/Allergies: Negative for environmental allergies and polydipsia. Does not bruise/bleed easily.  Psychiatric/Behavioral: Negative for depression, suicidal ideas, hallucinations, memory loss and substance abuse. The patient is not nervous/anxious and  does not have insomnia.        Objective:  Blood pressure 103/66, pulse (!) 55, height 5' 1"  (1.549 m), weight 157 lb (71.2 kg), last menstrual period 05/15/2020.   Physical Exam  Vitals reviewed. Constitutional: She is oriented to person, place, and time. She appears well-developed and well-nourished.  HENT:  Head: Normocephalic and atraumatic.        Right Ear: External ear normal.  Left Ear: External ear normal.  Nose: Nose normal.  Mouth/Throat: Oropharynx is clear and moist.  Eyes: Conjunctivae and EOM are normal. Pupils are equal, round, and reactive to light. Right eye exhibits no discharge. Left eye exhibits no discharge. No scleral icterus.  Neck: Normal range of motion. Neck supple. No tracheal deviation present. No thyromegaly present.  Cardiovascular: Normal rate, regular rhythm, normal heart sounds and intact distal pulses.  Exam reveals no gallop and no friction rub.   No murmur heard. Respiratory: Effort normal and breath sounds normal. No respiratory distress. She has no wheezes. She has no rales. She exhibits no tenderness.  GI: Soft. Bowel sounds are normal. She exhibits no distension and no mass. There is no tenderness. There is no rebound and no guarding.  Genitourinary:  Breasts no masses skin changes or nipple changes bilaterally      Vulva is normal without lesions Vagina is pink moist without discharge Cervix normal in appearance and pap is done Uterus is normal size shape and contour Adnexa is negative with normal sized ovaries   Musculoskeletal: Normal range of motion. She exhibits no edema and no tenderness.  Neurological: She is alert and oriented to person, place, and time. She has normal reflexes. She displays normal reflexes. No cranial nerve deficit. She exhibits normal muscle tone. Coordination normal.  Skin: Skin is warm and dry. No rash noted. No erythema. No pallor.  Psychiatric: She has a normal mood and affect. Her behavior is normal. Judgment and  thought content normal.       Medications Ordered at today's visit: No orders of the defined types were placed in this encounter.   Other orders placed at today's visit: Orders Placed This Encounter  Procedures  . Lipid Profile  . Comp Met (CMET)      Assessment:    Normal Gyn exam.    Plan:    if normal next Pap 3 years     Return in about 1 year (around 05/28/2021) for yearly.

## 2020-05-29 LAB — CYTOLOGY - PAP
Chlamydia: NEGATIVE
Comment: NEGATIVE
Comment: NEGATIVE
Comment: NORMAL
Diagnosis: NEGATIVE
High risk HPV: NEGATIVE
Neisseria Gonorrhea: NEGATIVE

## 2020-05-30 ENCOUNTER — Encounter: Payer: Self-pay | Admitting: *Deleted

## 2020-05-30 LAB — COMPREHENSIVE METABOLIC PANEL
ALT: 19 IU/L (ref 0–32)
AST: 17 IU/L (ref 0–40)
Albumin/Globulin Ratio: 2.1 (ref 1.2–2.2)
Albumin: 4.8 g/dL (ref 3.9–5.0)
Alkaline Phosphatase: 66 IU/L (ref 44–121)
BUN/Creatinine Ratio: 16 (ref 9–23)
BUN: 12 mg/dL (ref 6–20)
Bilirubin Total: 0.3 mg/dL (ref 0.0–1.2)
CO2: 23 mmol/L (ref 20–29)
Calcium: 9.6 mg/dL (ref 8.7–10.2)
Chloride: 101 mmol/L (ref 96–106)
Creatinine, Ser: 0.73 mg/dL (ref 0.57–1.00)
GFR calc Af Amer: 129 mL/min/{1.73_m2} (ref >59)
GFR calc non Af Amer: 112 mL/min/{1.73_m2} (ref >59)
Globulin, Total: 2.3 g/dL (ref 1.5–4.5)
Glucose: 85 mg/dL (ref 65–99)
Potassium: 3.9 mmol/L (ref 3.5–5.2)
Sodium: 139 mmol/L (ref 134–144)
Total Protein: 7.1 g/dL (ref 6.0–8.5)

## 2020-05-30 LAB — LIPID PANEL
Chol/HDL Ratio: 2.6 ratio (ref 0.0–4.4)
Cholesterol, Total: 153 mg/dL (ref 100–199)
HDL: 60 mg/dL (ref >39)
LDL Chol Calc (NIH): 82 mg/dL (ref 0–99)
Triglycerides: 50 mg/dL (ref 0–149)
VLDL Cholesterol Cal: 11 mg/dL (ref 5–40)

## 2020-07-03 ENCOUNTER — Other Ambulatory Visit: Payer: Medicaid Other | Admitting: Adult Health

## 2020-09-07 IMAGING — DX DG ANKLE COMPLETE 3+V*L*
3 series · 3 of 3 positions shown · non-contrast
Comparison: None.

CLINICAL DATA: Pain following fall

EXAM:
LEFT ANKLE COMPLETE - 3+ VIEW

[ankle ap]
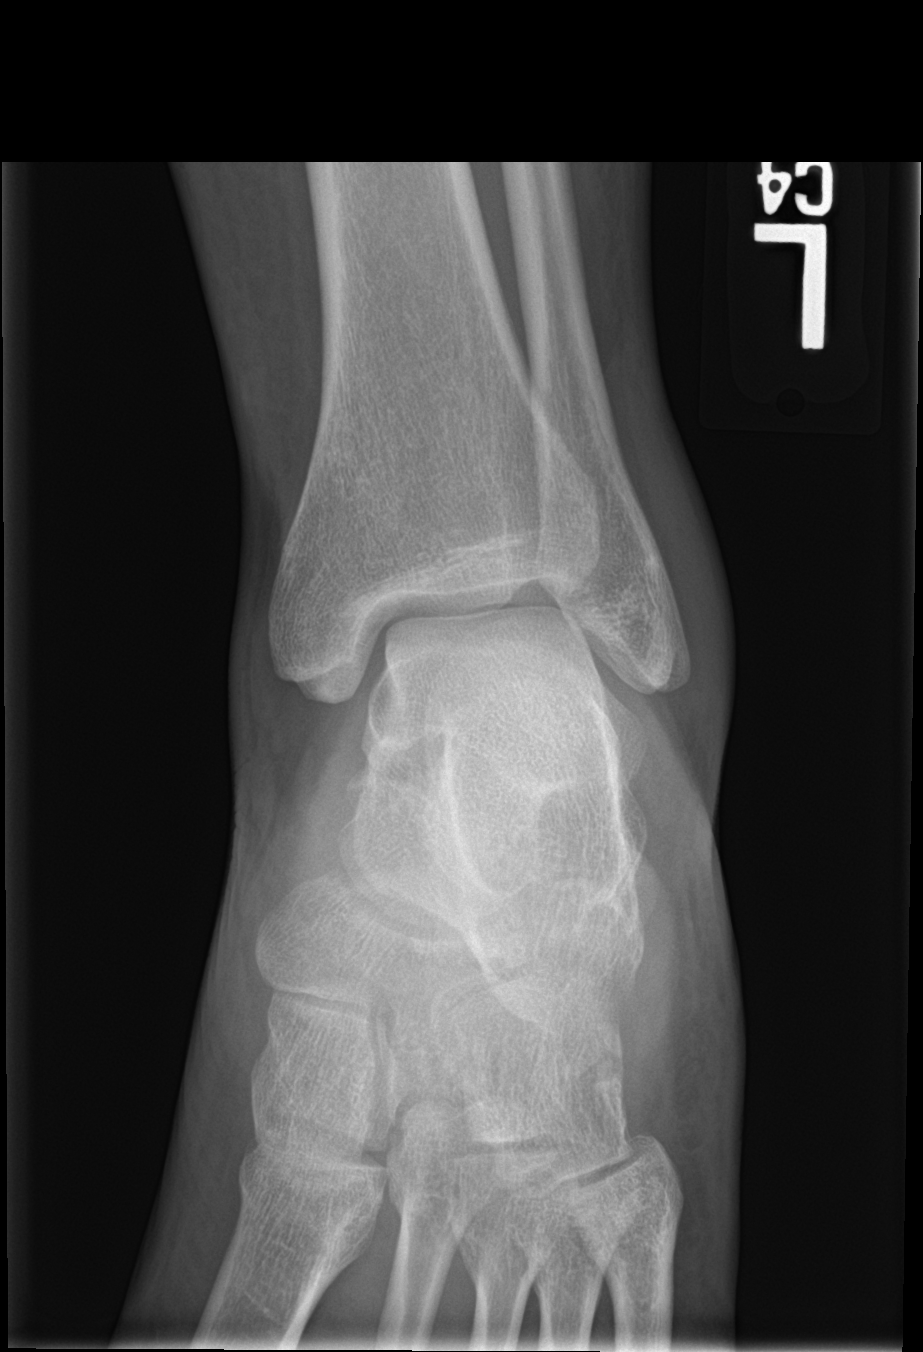

[ankle obl]
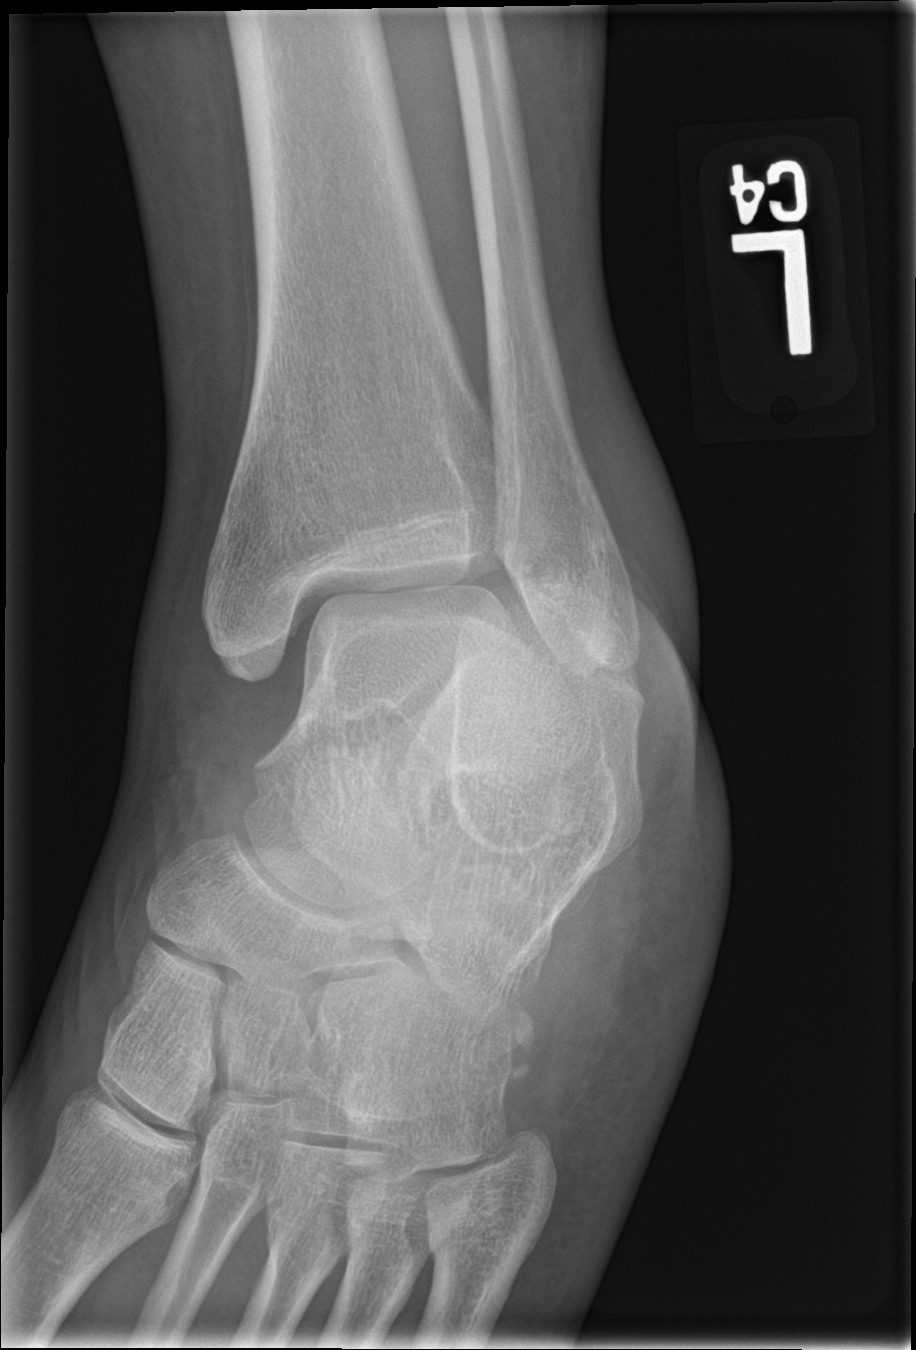

[ankle lat]
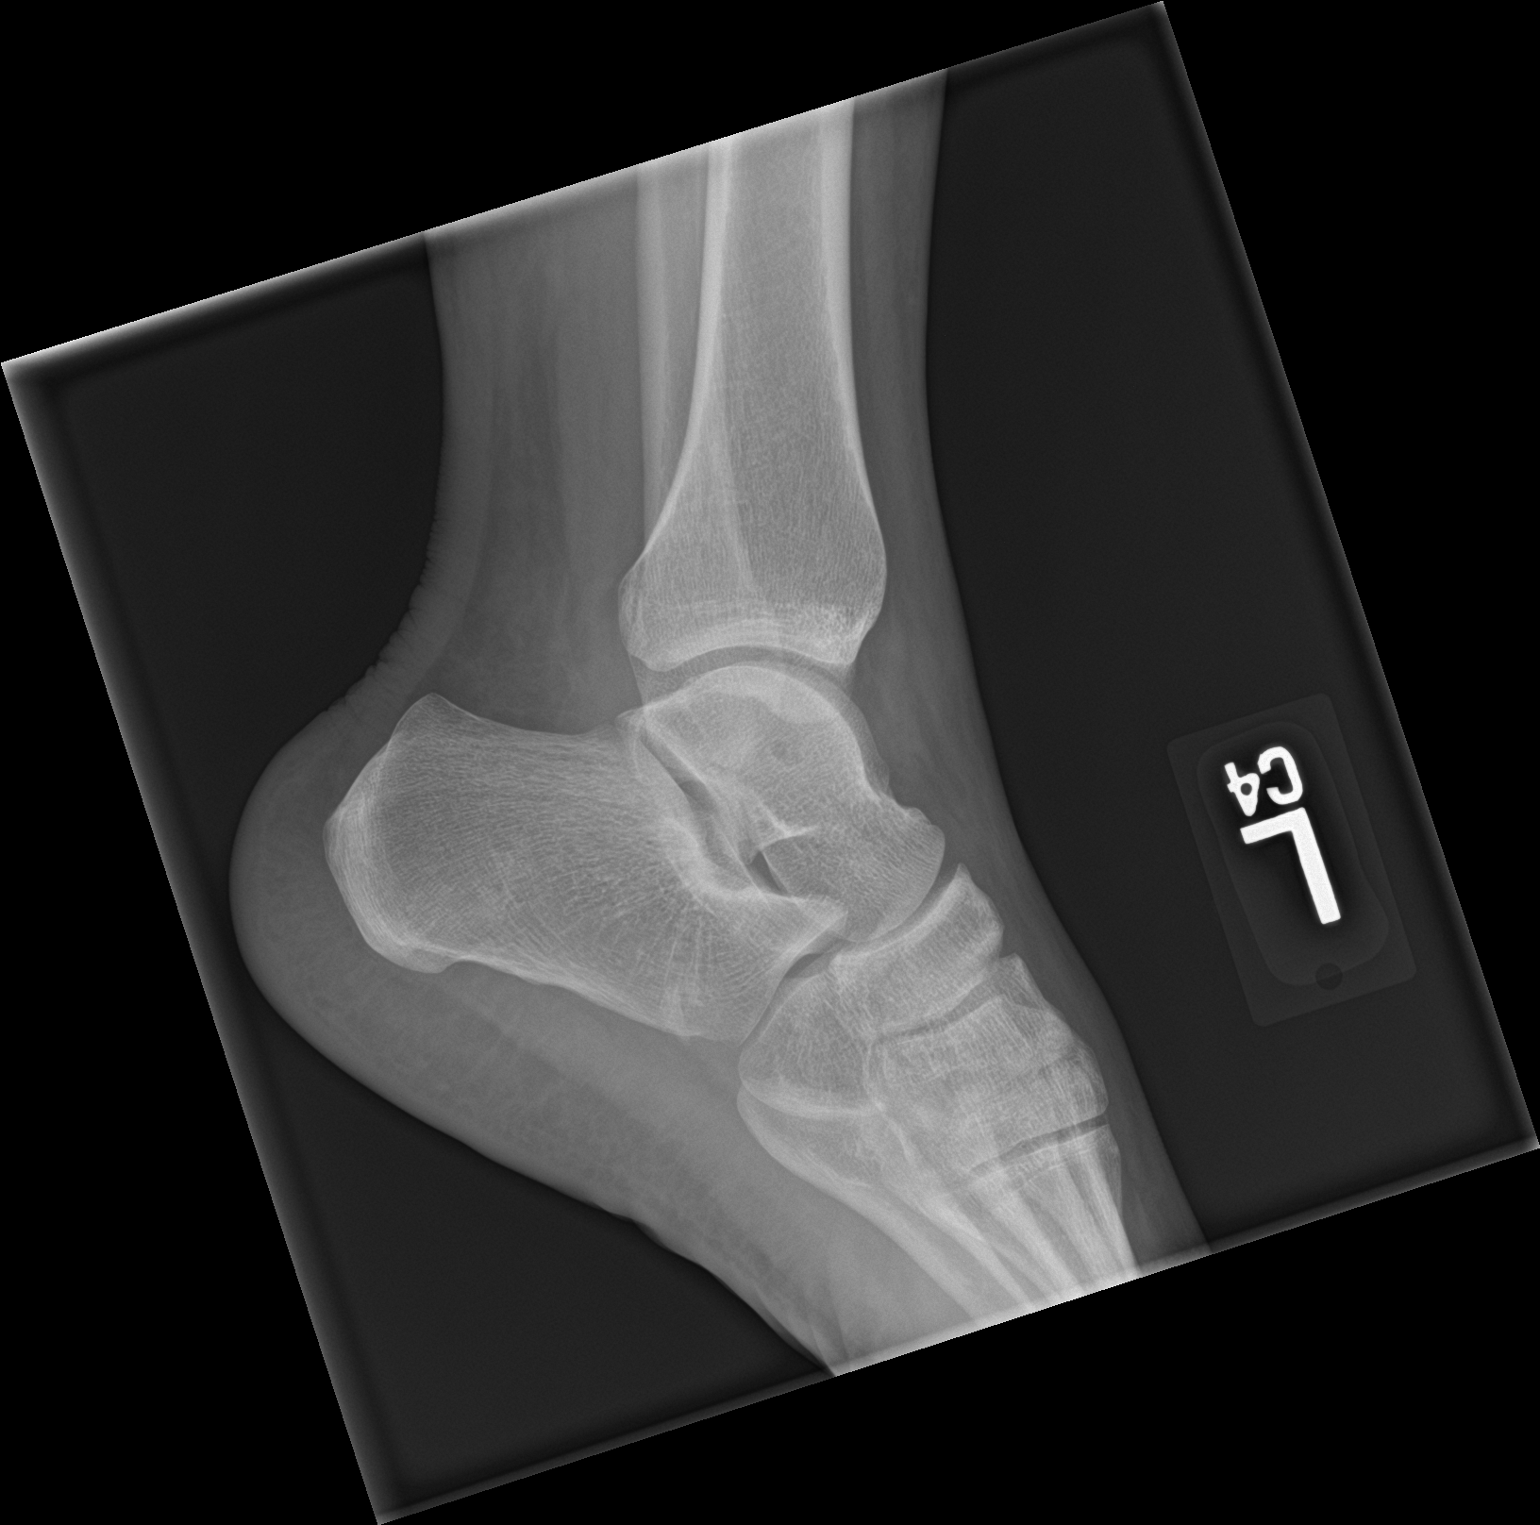

[3 of 3 positions shown; findings below may reference images not displayed]

FINDINGS: Frontal, oblique, and lateral views were obtained. There is mild
generalized soft tissue swelling. No evident fracture or joint
effusion. There is no appreciable joint space narrowing or erosion.
The ankle mortise appears intact.
IMPRESSION: Mild generalized soft tissue swelling. No evident fracture or
arthropathy. Ankle mortise appears intact.

## 2021-05-29 ENCOUNTER — Other Ambulatory Visit: Payer: Self-pay

## 2021-05-29 ENCOUNTER — Other Ambulatory Visit (HOSPITAL_COMMUNITY)
Admission: RE | Admit: 2021-05-29 | Discharge: 2021-05-29 | Disposition: A | Discharge status: Home / Self Care | Payer: Medicaid Other | Source: Ambulatory Visit | Attending: Obstetrics & Gynecology | Admitting: Obstetrics & Gynecology

## 2021-05-29 ENCOUNTER — Other Ambulatory Visit (INDEPENDENT_AMBULATORY_CARE_PROVIDER_SITE_OTHER): Payer: Medicaid Other

## 2021-05-29 DIAGNOSIS — N939 Abnormal uterine and vaginal bleeding, unspecified: Secondary | ICD-10-CM

## 2021-05-29 NOTE — Progress Notes (Signed)
Chart reviewed for nurse visit. Agree with plan of care.  Adline Potter, NP 05/29/2021 11:09 AM

## 2021-05-29 NOTE — Progress Notes (Addendum)
   NURSE VISIT- VAGINITIS/STD  SUBJECTIVE:  Kendra Farmer is a 30 y.o. [id number] GYN patientfemale here for a vaginal swab for vaginitis screening, STD screen.  She reports the following symptoms: abnormal bleeding: spotting  for 1 week. Denies significant pelvic pain, fever, or UTI symptoms.  OBJECTIVE:  There were no vitals taken for this visit.  Appears well, in no apparent distress  ASSESSMENT: Vaginal swab for  vaginitis & STD screening  PLAN: Self-collected vaginal probe for Gonorrhea, Chlamydia, Trichomonas, Bacterial Vaginosis, Yeast sent to lab Treatment: to be determined once results are received Follow-up as needed if symptoms persist/worsen, or new symptoms develop  Shaliyah Taite A Derra Shartzer  05/29/2021 9:12 AM

## 2021-06-01 ENCOUNTER — Other Ambulatory Visit: Payer: Self-pay

## 2021-06-01 ENCOUNTER — Emergency Department (HOSPITAL_COMMUNITY)
Admission: EM | Admit: 2021-06-01 | Discharge: 2021-06-01 | Disposition: A | Discharge status: Home / Self Care | Payer: Medicaid Other | Attending: Emergency Medicine | Admitting: Emergency Medicine

## 2021-06-01 ENCOUNTER — Encounter (HOSPITAL_COMMUNITY): Payer: Self-pay | Admitting: Emergency Medicine

## 2021-06-01 DIAGNOSIS — N939 Abnormal uterine and vaginal bleeding, unspecified: Secondary | ICD-10-CM | POA: Diagnosis not present

## 2021-06-01 DIAGNOSIS — Z87891 Personal history of nicotine dependence: Secondary | ICD-10-CM | POA: Diagnosis not present

## 2021-06-01 LAB — URINALYSIS, ROUTINE W REFLEX MICROSCOPIC
Bacteria, UA: NONE SEEN
Bilirubin Urine: NEGATIVE
Glucose, UA: NEGATIVE mg/dL
Ketones, ur: NEGATIVE mg/dL
Nitrite: NEGATIVE
Protein, ur: 30 mg/dL — AB
RBC / HPF: >50 RBC/hpf — ABNORMAL HIGH (ref 0–5)
Specific Gravity, Urine: 1.009 (ref 1.005–1.030)
pH: 6 (ref 5.0–8.0)

## 2021-06-01 LAB — POC URINE PREG, ED: Preg Test, Ur: NEGATIVE

## 2021-06-01 LAB — WET PREP, GENITAL
Clue Cells Wet Prep HPF POC: NONE SEEN
Sperm: NONE SEEN
Trich, Wet Prep: NONE SEEN
WBC, Wet Prep HPF POC: <10 — AB
Yeast Wet Prep HPF POC: NONE SEEN

## 2021-06-01 MED ORDER — LEVONORGESTREL-ETHINYL ESTRAD 0.15-30 MG-MCG PO TABS: 2.0000 | ORAL_TABLET | Freq: Two times a day (BID) | ORAL | 0 refills | Status: DC

## 2021-06-01 NOTE — Discharge Instructions (Addendum)
Take the prescribed medication for vaginal bleeding.  Take 2 tablets twice a day until your bleeding has stopped for 3 days. call your gynecologist for follow-up appointment.  If your bleeding worsens or you develop lightheadedness please return to the emergency department.

## 2021-06-01 NOTE — ED Provider Notes (Signed)
[facility] EMERGENCY DEPARTMENT Provider Note   CSN: [id number] Arrival date & time: 06/01/21  2038     History Chief Complaint  Patient presents with   Vaginal Bleeding    Kendra Farmer is a 30 y.o. female.  30 year old female presents today for evaluation of vaginal bleeding.  Her last menstrual period was 9/16.  She reports that initially started off with spotting which is unusual for her and since Thursday it has progressed to heavier bleeding with intermittent clots requiring 2 pads per day.  She endorses being sexually active 2 weeks ago where she did not use protection.  Asides from bleeding she did not note any other vaginal discharge.  She reports occasional cramping but denies any other abdominal pain.  Denies dysuria or any other urinary complaints.  She does not currently take any medications, including birth control.  She reports she had a tubal ligation.  She reports history of 4 pregnancies.  She reports Thursday she went into her primary care's office for STD testing, but did not have vaginal bleeding addressed.  She denies associated lightheadedness, fever, or back pain.   The history is provided by the patient. No language interpreter was used.  Vaginal Bleeding Associated symptoms: no abdominal pain, no dysuria, no fever, no nausea and no vaginal discharge   Risk factors: unprotected sex       Past Medical History:  Diagnosis Date   Syphilis 2015    Patient Active Problem List   Diagnosis Date Noted   Trichomonas infection 07/26/2018    Past Surgical History:  Procedure Laterality Date   APPENDECTOMY     TUBAL LIGATION Bilateral 09/17/2016   Procedure: POST PARTUM TUBAL LIGATION;  Surgeon: Adam Phenix, MD;  Location: [facility] SUIT[facility];  Service: Gynecology;  Laterality: Bilateral;     OB History     Gravida  4   Para  4   Term  4   Preterm      AB      Living  4      SAB      IAB      Ectopic      Multiple  0   Live Births  4            History reviewed. No pertinent family history.  Social History   Tobacco Use   Smoking status: Former    Years: 1.00    Types: Cigarettes   Smokeless tobacco: Never  Vaping Use   Vaping Use: Never used  Substance Use Topics   Alcohol use: No   Drug use: No    Home Medications Prior to Admission medications   Not on File    Allergies    Patient has no known allergies.  Review of Systems   Review of Systems  Constitutional:  Negative for activity change, appetite change and fever.  Gastrointestinal:  Negative for abdominal distention, abdominal pain and nausea.  Genitourinary:  Positive for vaginal bleeding. Negative for difficulty urinating, dysuria, vaginal discharge and vaginal pain.  Skin:  Negative for pallor.  All other systems reviewed and are negative.  Physical Exam Updated Vital Signs BP 129/81 (BP Location: Right Arm)   Pulse 79   Temp 98 F (36.7 C) (Oral)   Resp 18   Ht 5\' 1"  (1.549 m)   Wt 68 kg   LMP 05/09/2021 Comment: Tubes Tied  SpO2 100%   BMI 28.34 kg/m   Physical Exam Vitals and nursing note reviewed. Exam  conducted with a chaperone present.  Constitutional:      General: She is not in acute distress.    Appearance: Normal appearance. She is not ill-appearing.  HENT:     Head: Normocephalic and atraumatic.     Nose: Nose normal.  Eyes:     Conjunctiva/sclera: Conjunctivae normal.  Cardiovascular:     Rate and Rhythm: Normal rate and regular rhythm.  Pulmonary:     Effort: Pulmonary effort is normal. No respiratory distress.     Breath sounds: Normal breath sounds. No wheezing.  Genitourinary:    General: Normal vulva.     Exam position: Lithotomy position.     Pubic Area: No rash.      Labia:        Right: No rash, lesion or injury.        Left: No rash, lesion or injury.      Vagina: Normal. No signs of injury and foreign body. No vaginal discharge or tenderness.     Cervix: No discharge, lesion, erythema or  cervical bleeding.     Uterus: Normal. Not tender.      Adnexa: Right adnexa normal and left adnexa normal.       Right: No mass, tenderness or fullness.         Left: No mass, tenderness or fullness.       Comments: External genitalia without erythema, exudate or discharge.  On speculum exam vaginal vault with pooling of blood.  Blood was cleansed with dry gauze.  Cervix is of normal color without lesion. There is no active bleeding noted.  Uterus is noted to be of normal size and nontender.  Patient was without cervical motion tenderness.  No masses are palpated.  Adnexa was also without masses or tenderness.  Musculoskeletal:        General: No deformity.  Skin:    Findings: No rash.  Neurological:     Mental Status: She is alert.    ED Results / Procedures / Treatments   Labs (all labs ordered are listed, but only abnormal results are displayed) Labs Reviewed - No data to display  EKG None  Radiology No results found.  Procedures Procedures   Medications Ordered in ED Medications - No data to display  ED Course  I have reviewed the triage vital signs and the nursing notes.  Pertinent labs & imaging results that were available during my care of the patient were reviewed by me and considered in my medical decision making (see chart for details).    MDM Rules/Calculators/A&P                           30 year old female presents today for evaluation of vaginal bleeding ongoing since 9/16.  Initially started as spotting and has progressed to heavy bleeding since Thursday.  She denies lightheadedness or other complaints.  Blood was appreciated in vaginal vault, but no active bleeding noted.  Will discharge patient on levora.  She wishes to follow-up with her personal gynecologist.  Return precautions discussed.  Patient voices understanding and is in agreement with plan.  Final Clinical Impression(s) / ED Diagnoses Final diagnoses:  None    Rx / DC Orders ED Discharge  Orders     None        Marita Kansas, Cordelia Poche 06/01/21 2325    Eber Hong, MD 06/02/21 1758

## 2021-06-01 NOTE — ED Triage Notes (Signed)
Pt states her last menstrual cycle ended on Sept 16th and that she started spotting last week and that now the bleeding is "heavier".  Pt states she is going through approximately 2 pads/day.

## 2021-06-02 ENCOUNTER — Other Ambulatory Visit: Payer: Self-pay | Admitting: Adult Health

## 2021-06-02 ENCOUNTER — Telehealth: Payer: Self-pay

## 2021-06-02 LAB — CERVICOVAGINAL ANCILLARY ONLY
Bacterial Vaginitis (gardnerella): POSITIVE — AB
Candida Glabrata: NEGATIVE
Candida Vaginitis: NEGATIVE
Chlamydia: NEGATIVE
Comment: NEGATIVE
Comment: NEGATIVE
Comment: NEGATIVE
Comment: NEGATIVE
Comment: NEGATIVE
Comment: NORMAL
Neisseria Gonorrhea: NEGATIVE
Trichomonas: NEGATIVE

## 2021-06-02 MED ORDER — METRONIDAZOLE 500 MG PO TABS: 500.0000 mg | ORAL_TABLET | Freq: Two times a day (BID) | ORAL | 0 refills | Status: DC

## 2021-06-02 NOTE — Progress Notes (Signed)
+  BV on vaginal swab will rx flagyl 

## 2021-06-02 NOTE — Telephone Encounter (Signed)
She can keep her scheduled appt

## 2021-06-02 NOTE — Telephone Encounter (Signed)
Transition Care Management Follow-up Telephone Call Date of discharge and from where: 06/01/2021-[facility]  How have you been since you were released from the hospital? Patient stated she is doing fine.  Any questions or concerns? No  Items Reviewed: Did the pt receive and understand the discharge instructions provided? Yes  Medications obtained and verified? Yes  Other? No  Any new allergies since your discharge? No  Dietary orders reviewed? No Do you have support at home? Yes   Home Care and Equipment/Supplies: Were home health services ordered? not applicable If so, what is the name of the agency? N/A  Has the agency set up a time to come to the patient's home? not applicable Were any new equipment or medical supplies ordered?  No What is the name of the medical supply agency? N/A Were you able to get the supplies/equipment? not applicable Do you have any questions related to the use of the equipment or supplies? No  Functional Questionnaire: (I = Independent and D = Dependent) ADLs: I  Bathing/Dressing- I  Meal Prep- I  Eating- I  Maintaining continence- I  Transferring/Ambulation- I  Managing Meds- I  Follow up appointments reviewed:  PCP Hospital f/u appt confirmed? No   Specialist Hospital f/u appt confirmed? Yes  Scheduled to see Dr. Despina Hidden on 06/10/2021 @ 11:30 am. Are transportation arrangements needed? No  If their condition worsens, is the pt aware to call PCP or go to the Emergency Dept.? Yes Was the patient provided with contact information for the PCP's office or ED? Yes Was to pt encouraged to call back with questions or concerns? Yes

## 2021-06-03 LAB — GC/CHLAMYDIA PROBE AMP (~~LOC~~) NOT AT ARMC
Chlamydia: NEGATIVE
Comment: NEGATIVE
Comment: NORMAL
Neisseria Gonorrhea: NEGATIVE

## 2021-06-10 ENCOUNTER — Encounter: Payer: Self-pay | Admitting: Obstetrics & Gynecology

## 2021-06-10 ENCOUNTER — Other Ambulatory Visit: Payer: Self-pay

## 2021-06-10 ENCOUNTER — Ambulatory Visit (INDEPENDENT_AMBULATORY_CARE_PROVIDER_SITE_OTHER): Payer: Medicaid Other | Admitting: Obstetrics & Gynecology

## 2021-06-10 VITALS — BP 99/60 | HR 58 | Ht 61.0 in | Wt 167.6 lb

## 2021-06-10 DIAGNOSIS — Z01419 Encounter for gynecological examination (general) (routine) without abnormal findings: Secondary | ICD-10-CM | POA: Diagnosis not present

## 2021-06-10 NOTE — Progress Notes (Signed)
Subjective:     Kendra Farmer is a 30 y.o. female here for a routine exam.  Patient's last menstrual period was 05/16/2021. [id number] Birth Control Method:  BTL Menstrual Calendar(currently): regular in general last one was unusual  Current complaints: none.   Current acute medical issues:  none   Recent Gynecologic History Patient's last menstrual period was 05/16/2021. Last Pap: 2021,  normal Last mammogram: n/a,    Past Medical History:  Diagnosis Date   Syphilis 2015    Past Surgical History:  Procedure Laterality Date   APPENDECTOMY     TUBAL LIGATION Bilateral 09/17/2016   Procedure: POST PARTUM TUBAL LIGATION;  Surgeon: Adam Phenix, MD;  Location: [facility] BIRTHING SUIT[facility];  Service: Gynecology;  Laterality: Bilateral;    OB History     Gravida  4   Para  4   Term  4   Preterm      AB      Living  4      SAB      IAB      Ectopic      Multiple  0   Live Births  4           Social History   Socioeconomic History   Marital status: Single    Spouse name: Not on file   Number of children: Not on file   Years of education: Not on file   Highest education level: Not on file  Occupational History   Not on file  Tobacco Use   Smoking status: Former    Years: 1.00    Types: Cigarettes   Smokeless tobacco: Never  Vaping Use   Vaping Use: Never used  Substance and Sexual Activity   Alcohol use: No   Drug use: No   Sexual activity: Not Currently    Birth control/protection: Surgical    Comment: tubal  Other Topics Concern   Not on file  Social History Narrative   Not on file   Social Determinants of Health   Financial Resource Strain: Low Risk    Difficulty of Paying Living Expenses: Not hard at all  Food Insecurity: No Food Insecurity   Worried About Programme researcher, broadcasting/film/video in the Last Year: Never true   Ran Out of Food in the Last Year: Never true  Transportation Needs: No Transportation Needs   Lack of Transportation (Medical): No    Lack of Transportation (Non-Medical): No  Physical Activity: Insufficiently Active   Days of Exercise per Week: 3 days   Minutes of Exercise per Session: 30 min  Stress: No Stress Concern Present   Feeling of Stress : Not at all  Social Connections: Socially Isolated   Frequency of Communication with Friends and Family: Once a week   Frequency of Social Gatherings with Friends and Family: Once a week   Attends Religious Services: Never   Database administrator or Organizations: No   Attends Banker Meetings: Never   Marital Status: Never married    History reviewed. No pertinent family history.   Current Outpatient Medications:    levonorgestrel-ethinyl estradiol (LEVORA 0.15/30, 28,) 0.15-30 MG-MCG tablet, Take 2 tablets by mouth 2 (two) times daily for 14 days. You can stop sooner if your bleeding has stopped for at least 3 days. (Patient not taking: Reported on 06/10/2021), Disp: 56 tablet, Rfl: 0   metroNIDAZOLE (FLAGYL) 500 MG tablet, Take 1 tablet (500 mg total) by mouth 2 (two) times daily. (Patient not  taking: Reported on 06/10/2021), Disp: 14 tablet, Rfl: 0  Review of Systems  Review of Systems  Constitutional: Negative for fever, chills, weight loss, malaise/fatigue and diaphoresis.  HENT: Negative for hearing loss, ear pain, nosebleeds, congestion, sore throat, neck pain, tinnitus and ear discharge.   Eyes: Negative for blurred vision, double vision, photophobia, pain, discharge and redness.  Respiratory: Negative for cough, hemoptysis, sputum production, shortness of breath, wheezing and stridor.   Cardiovascular: Negative for chest pain, palpitations, orthopnea, claudication, leg swelling and PND.  Gastrointestinal: negative for abdominal pain. Negative for heartburn, nausea, vomiting, diarrhea, constipation, blood in stool and melena.  Genitourinary: Negative for dysuria, urgency, frequency, hematuria and flank pain.  Musculoskeletal: Negative for myalgias,  back pain, joint pain and falls.  Skin: Negative for itching and rash.  Neurological: Negative for dizziness, tingling, tremors, sensory change, speech change, focal weakness, seizures, loss of consciousness, weakness and headaches.  Endo/Heme/Allergies: Negative for environmental allergies and polydipsia. Does not bruise/bleed easily.  Psychiatric/Behavioral: Negative for depression, suicidal ideas, hallucinations, memory loss and substance abuse. The patient is not nervous/anxious and does not have insomnia.        Objective:  Blood pressure 99/60, pulse (!) 58, height 5\' 1"  (1.549 m), weight 167 lb 9.6 oz (76 kg), last menstrual period 05/16/2021.   Physical Exam  Vitals reviewed. Constitutional: She is oriented to person, place, and time. She appears well-developed and well-nourished.  HENT:  Head: Normocephalic and atraumatic.        Right Ear: External ear normal.  Left Ear: External ear normal.  Nose: Nose normal.  Mouth/Throat: Oropharynx is clear and moist.  Eyes: Conjunctivae and EOM are normal. Pupils are equal, round, and reactive to light. Right eye exhibits no discharge. Left eye exhibits no discharge. No scleral icterus.  Neck: Normal range of motion. Neck supple. No tracheal deviation present. No thyromegaly present.  Cardiovascular: Normal rate, regular rhythm, normal heart sounds and intact distal pulses.  Exam reveals no gallop and no friction rub.   No murmur heard. Respiratory: Effort normal and breath sounds normal. No respiratory distress. She has no wheezes. She has no rales. She exhibits no tenderness.  GI: Soft. Bowel sounds are normal. She exhibits no distension and no mass. There is no tenderness. There is no rebound and no guarding.  Genitourinary:  Breasts no masses skin changes or nipple changes bilaterally      Vulva is normal without lesions Vagina is pink moist without discharge Cervix normal in appearance and pap is not done Uterus is normal size shape  and contour Adnexa is negative with normal sized ovaries   Musculoskeletal: Normal range of motion. She exhibits no edema and no tenderness.  Neurological: She is alert and oriented to person, place, and time. She has normal reflexes. She displays normal reflexes. No cranial nerve deficit. She exhibits normal muscle tone. Coordination normal.  Skin: Skin is warm and dry. No rash noted. No erythema. No pallor.  Psychiatric: She has a normal mood and affect. Her behavior is normal. Judgment and thought content normal.       Medications Ordered at today's visit: No orders of the defined types were placed in this encounter.   Other orders placed at today's visit: No orders of the defined types were placed in this encounter.     Assessment:    Normal Gyn exam.    Plan:    Contraception: tubal ligation. Follow up in: 2 years.     Return in about 2  years (around 06/11/2023) for yearly.

## 2022-02-02 ENCOUNTER — Telehealth: Payer: Medicaid Other | Admitting: Physician Assistant

## 2022-02-02 DIAGNOSIS — B3731 Acute candidiasis of vulva and vagina: Secondary | ICD-10-CM | POA: Diagnosis not present

## 2022-02-02 MED ORDER — FLUCONAZOLE 150 MG PO TABS: 150.0000 mg | ORAL_TABLET | Freq: Once | ORAL | 0 refills | Status: AC

## 2022-02-02 NOTE — Progress Notes (Signed)

## 2022-02-16 ENCOUNTER — Other Ambulatory Visit (HOSPITAL_COMMUNITY)
Admission: RE | Admit: 2022-02-16 | Discharge: 2022-02-16 | Disposition: A | Discharge status: Home / Self Care | Payer: Medicaid Other | Source: Ambulatory Visit | Attending: Obstetrics & Gynecology | Admitting: Obstetrics & Gynecology

## 2022-02-16 ENCOUNTER — Other Ambulatory Visit (INDEPENDENT_AMBULATORY_CARE_PROVIDER_SITE_OTHER): Payer: Medicaid Other

## 2022-02-16 DIAGNOSIS — N898 Other specified noninflammatory disorders of vagina: Secondary | ICD-10-CM

## 2022-02-16 NOTE — Progress Notes (Signed)
   NURSE VISIT- VAGINITIS/STD  SUBJECTIVE:  Kendra Farmer is a 31 y.o. [id number] GYN patientfemale here for a vaginal swab for vaginitis screening, STD screen.  She reports the following symptoms: discharge described as creamy for 1 week. Denies abnormal vaginal bleeding, significant pelvic pain, fever, or UTI symptoms.  OBJECTIVE:  There were no vitals taken for this visit.  Appears well, in no apparent distress  ASSESSMENT: Vaginal swab for vaginitis screening/STD screen  PLAN: Self-collected vaginal probe for Gonorrhea, Chlamydia, Trichomonas, Bacterial Vaginosis, Yeast sent to lab Treatment: to be determined once results are received Follow-up as needed if symptoms persist/worsen, or new symptoms develop  Jobe Marker  02/16/2022 10:10 AM

## 2022-02-17 ENCOUNTER — Other Ambulatory Visit: Payer: Self-pay | Admitting: Adult Health

## 2022-02-17 DIAGNOSIS — A599 Trichomoniasis, unspecified: Secondary | ICD-10-CM

## 2022-02-17 LAB — CERVICOVAGINAL ANCILLARY ONLY
Bacterial Vaginitis (gardnerella): POSITIVE — AB
Candida Glabrata: NEGATIVE
Candida Vaginitis: NEGATIVE
Chlamydia: NEGATIVE
Comment: NEGATIVE
Comment: NEGATIVE
Comment: NEGATIVE
Comment: NEGATIVE
Comment: NEGATIVE
Comment: NORMAL
Neisseria Gonorrhea: NEGATIVE
Trichomonas: POSITIVE — AB

## 2022-02-17 MED ORDER — METRONIDAZOLE 500 MG PO TABS: 500.0000 mg | ORAL_TABLET | Freq: Two times a day (BID) | ORAL | 0 refills | Status: DC

## 2022-02-17 NOTE — Progress Notes (Signed)
+  trich and BV on vaginal swag will rx flagyl.

## 2022-08-05 ENCOUNTER — Other Ambulatory Visit: Payer: Self-pay

## 2022-08-05 ENCOUNTER — Emergency Department (HOSPITAL_COMMUNITY): Payer: Medicaid Other

## 2022-08-05 ENCOUNTER — Encounter (HOSPITAL_COMMUNITY): Payer: Self-pay | Admitting: *Deleted

## 2022-08-05 ENCOUNTER — Emergency Department (HOSPITAL_COMMUNITY)
Admission: EM | Admit: 2022-08-05 | Discharge: 2022-08-05 | Disposition: A | Discharge status: Home / Self Care | Payer: Medicaid Other | Attending: Emergency Medicine | Admitting: Emergency Medicine

## 2022-08-05 DIAGNOSIS — R509 Fever, unspecified: Secondary | ICD-10-CM | POA: Diagnosis not present

## 2022-08-05 DIAGNOSIS — R079 Chest pain, unspecified: Secondary | ICD-10-CM | POA: Diagnosis not present

## 2022-08-05 DIAGNOSIS — J101 Influenza due to other identified influenza virus with other respiratory manifestations: Secondary | ICD-10-CM | POA: Diagnosis not present

## 2022-08-05 DIAGNOSIS — Z1152 Encounter for screening for COVID-19: Secondary | ICD-10-CM | POA: Diagnosis not present

## 2022-08-05 LAB — RESP PANEL BY RT-PCR (FLU A&B, COVID) ARPGX2
Influenza A by PCR: NEGATIVE
Influenza B by PCR: POSITIVE — AB
SARS Coronavirus 2 by RT PCR: NEGATIVE

## 2022-08-05 NOTE — ED Provider Notes (Signed)
[facility] EMERGENCY DEPARTMENT Provider Note   CSN: [id number] Arrival date & time: 08/05/22  0755     History  Chief Complaint  Patient presents with   Generalized Body Aches    Kendra Farmer is a 31 y.o. female with no significant past medical history presenting for evaluation of flulike symptoms.  She endorses generalized body aches, subjective fever, rhinorrhea and a headache since yesterday.  She has had no shortness of breath, nausea or vomiting, diaphoresis but does endorse fleeting episodes of sharp right-sided chest pain, last experienced an episode yesterday.  She denies significant cough or shortness of breath.  She is found no triggers for this pain.  She has had no medications for her symptoms prior to arrival.  Of note both of her children who are present with her have similar symptoms they are starting prior to the patient's symptoms.    The history is provided by the patient.       Home Medications Prior to Admission medications   Medication Sig Start Date End Date Taking? Authorizing Provider  levonorgestrel-ethinyl estradiol (LEVORA 0.15/30, 28,) 0.15-30 MG-MCG tablet Take 2 tablets by mouth 2 (two) times daily for 14 days. You can stop sooner if your bleeding has stopped for at least 3 days. Patient not taking: Reported on 06/10/2021 06/01/21 06/15/21  Marita Kansas, PA-C  metroNIDAZOLE (FLAGYL) 500 MG tablet Take 1 tablet (500 mg total) by mouth 2 (two) times daily. 02/17/22   Adline Potter, NP      Allergies    Patient has no known allergies.    Review of Systems   Review of Systems  Constitutional:  Positive for chills and fever.  HENT:  Positive for rhinorrhea. Negative for congestion, ear pain, sinus pressure, sore throat, trouble swallowing and voice change.   Eyes:  Negative for discharge.  Respiratory:  Positive for cough. Negative for shortness of breath, wheezing and stridor.   Cardiovascular:  Positive for chest pain.  Gastrointestinal:   Negative for abdominal pain, nausea and vomiting.  Genitourinary: Negative.     Physical Exam Updated Vital Signs BP 112/72 (BP Location: Right Arm)   Pulse 89   Temp 99.2 F (37.3 C) (Oral)   Resp 20   Ht 5\' 1"  (1.549 m)   Wt 68 kg   LMP 07/20/2022 (Approximate)   SpO2 100%   BMI 28.34 kg/m  Physical Exam Constitutional:      Appearance: She is well-developed.  HENT:     Head: Normocephalic and atraumatic.     Right Ear: Tympanic membrane and ear canal normal.     Left Ear: Tympanic membrane and ear canal normal.     Nose: Mucosal edema and rhinorrhea present.     Mouth/Throat:     Mouth: Mucous membranes are moist.     Pharynx: Oropharynx is clear. Uvula midline. No oropharyngeal exudate or posterior oropharyngeal erythema.     Tonsils: No tonsillar abscesses.  Eyes:     Conjunctiva/sclera: Conjunctivae normal.  Cardiovascular:     Rate and Rhythm: Normal rate.     Heart sounds: Normal heart sounds.  Pulmonary:     Effort: Pulmonary effort is normal. No respiratory distress.     Breath sounds: No wheezing, rhonchi or rales.  Musculoskeletal:        General: Normal range of motion.  Skin:    General: Skin is warm and dry.     Findings: No rash.  Neurological:     Mental Status: She is  alert and oriented to person, place, and time.     ED Results / Procedures / Treatments   Labs (all labs ordered are listed, but only abnormal results are displayed) Labs Reviewed  RESP PANEL BY RT-PCR (FLU A&B, COVID) ARPGX2 - Abnormal; Notable for the following components:      Result Value   Influenza B by PCR POSITIVE (*)    All other components within normal limits    EKG None  Radiology DG Chest 2 View  Result Date: 08/05/2022 CLINICAL DATA:  Fever, chest pain, body aches EXAM: CHEST - 2 VIEW COMPARISON:  None Available. FINDINGS: The heart size and mediastinal contours are within normal limits. Both lungs are clear. The visualized skeletal structures are  unremarkable. IMPRESSION: No acute abnormality of the lungs. Electronically Signed   By: Jearld Lesch M.D.   On: 08/05/2022 09:40    Procedures Procedures    Medications Ordered in ED Medications - No data to display  ED Course/ Medical Decision Making/ A&P                           Medical Decision Making Patient presenting with symptoms suggesting a viral URI, both of her children are ill with similar symptoms.  It is also possible she has COVID or influenza, she is not vaccinated for either of these infections.  Labs revealing positive for influenza B.  She is hemodynamically stable, no respiratory distress, no complaints of shortness of breath.  We discussed Tamiflu, pros and cons, patient does not desirous of medications.  We discussed home treatment and return precautions.  She is stable at time of discharge.  Amount and/or Complexity of Data Reviewed Labs: ordered.    Details: Positive for influenza B Radiology: ordered and independent interpretation performed.    Details: Agree with interpretation, chest x-ray is clear, no pneumonia.           Final Clinical Impression(s) / ED Diagnoses Final diagnoses:  Influenza B    Rx / DC Orders ED Discharge Orders     None         Victoriano Lain 08/05/22 1052    Gloris Manchester, MD 08/08/22 [phone]

## 2022-08-05 NOTE — Discharge Instructions (Signed)
You have the flu (I suspect your children do as well).  Rest,  drink plenty of fluids to avoid getting dehydrated.  I recommend taking tylenol or motrin for fever reduction and to help you with your body aches.  Your chest xray is clear - no sign of pneumonia.  Get rechecked if you have worsening weakness or you develop shortness of breath or any new complaints.

## 2022-08-05 NOTE — ED Triage Notes (Signed)
Pt c/o generalized body aches with fever and headache that started yesterday

## 2022-08-07 ENCOUNTER — Telehealth: Payer: Self-pay | Admitting: *Deleted

## 2022-08-07 NOTE — Patient Outreach (Signed)
  Care Coordination [facility] Note Transition Care Management Unsuccessful Follow-up Telephone Call  Date of discharge and from where:  08/05/22 from [facility]  Attempts:  1st Attempt  Reason for unsuccessful TCM follow-up call:  Missing or invalid number   Estanislado Emms RN, BSN [place]  [employer]

## 2022-09-15 DIAGNOSIS — Z23 Encounter for immunization: Secondary | ICD-10-CM | POA: Diagnosis not present

## 2022-12-21 ENCOUNTER — Emergency Department (HOSPITAL_COMMUNITY): Payer: BC Managed Care – PPO

## 2022-12-21 ENCOUNTER — Other Ambulatory Visit: Payer: Self-pay

## 2022-12-21 ENCOUNTER — Encounter (HOSPITAL_COMMUNITY): Payer: Self-pay

## 2022-12-21 ENCOUNTER — Emergency Department (HOSPITAL_COMMUNITY)
Admission: EM | Admit: 2022-12-21 | Discharge: 2022-12-21 | Disposition: A | Discharge status: Home / Self Care | Payer: BC Managed Care – PPO | Attending: Emergency Medicine | Admitting: Emergency Medicine

## 2022-12-21 DIAGNOSIS — S161XXA Strain of muscle, fascia and tendon at neck level, initial encounter: Secondary | ICD-10-CM | POA: Diagnosis not present

## 2022-12-21 DIAGNOSIS — M549 Dorsalgia, unspecified: Secondary | ICD-10-CM | POA: Diagnosis not present

## 2022-12-21 DIAGNOSIS — Y92410 Unspecified street and highway as the place of occurrence of the external cause: Secondary | ICD-10-CM | POA: Insufficient documentation

## 2022-12-21 DIAGNOSIS — M542 Cervicalgia: Secondary | ICD-10-CM | POA: Diagnosis present

## 2022-12-21 DIAGNOSIS — S39012A Strain of muscle, fascia and tendon of lower back, initial encounter: Secondary | ICD-10-CM | POA: Insufficient documentation

## 2022-12-21 MED ORDER — IBUPROFEN 800 MG PO TABS: 800.0000 mg | ORAL_TABLET | Freq: Three times a day (TID) | ORAL | 0 refills | Status: AC

## 2022-12-21 MED ORDER — METHOCARBAMOL 500 MG PO TABS: 500.0000 mg | ORAL_TABLET | Freq: Three times a day (TID) | ORAL | 0 refills | Status: DC

## 2022-12-21 MED ORDER — METHOCARBAMOL 500 MG PO TABS
500.0000 mg | ORAL_TABLET | Freq: Once | ORAL | Status: AC
  Administered 2022-12-21: 500 mg via ORAL
  Filled 2022-12-21: qty 1

## 2022-12-21 MED ORDER — IBUPROFEN 800 MG PO TABS
800.0000 mg | ORAL_TABLET | Freq: Once | ORAL | Status: AC
  Administered 2022-12-21: 800 mg via ORAL
  Filled 2022-12-21: qty 1

## 2022-12-21 NOTE — ED Notes (Signed)
Patient transported to CT 

## 2022-12-21 NOTE — Discharge Instructions (Signed)
Your x-rays and CTs tonight are reassuring.  You will likely be sore tomorrow.  Alternate ice on and off to your neck and lower back.  Follow-up with your primary care provider for recheck or return to the emergency department for any new or worsening symptoms
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
## 2023-02-16 ENCOUNTER — Ambulatory Visit (INDEPENDENT_AMBULATORY_CARE_PROVIDER_SITE_OTHER): Payer: BC Managed Care – PPO | Admitting: Orthopaedic Surgery

## 2023-02-16 ENCOUNTER — Encounter: Payer: Self-pay | Admitting: Orthopaedic Surgery

## 2023-02-16 VITALS — BP 120/62 | HR 58 | Ht 61.0 in | Wt 150.0 lb

## 2023-02-16 DIAGNOSIS — M545 Low back pain, unspecified: Secondary | ICD-10-CM | POA: Diagnosis not present

## 2023-02-16 NOTE — Patient Instructions (Addendum)
Make sure you are walking as much as you can at the park or in [facility] out 15- 20 minutes each time add on about at a time, until you have increased your activity level   Use ice on your back  Back Exercises The following exercises strengthen the muscles that help to support the trunk (torso) and back. They also help to keep the lower back flexible. Doing these exercises can help to prevent or lessen existing low back pain. If you have back pain or discomfort, try doing these exercises 2-3 times each day or as told by your health care provider. As your pain improves, do them once each day, but increase the number of times that you repeat the steps for each exercise (do more repetitions). To prevent the recurrence of back pain, continue to do these exercises once each day or as told by your health care provider. Do exercises exactly as told by your health care provider and adjust them as directed. It is normal to feel mild stretching, pulling, tightness, or discomfort as you do these exercises, but you should stop right away if you feel sudden pain or your pain gets worse. Exercises Single knee to chest Repeat these steps 3-5 times for each leg: Lie on your back on a firm bed or the floor with your legs extended.
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
## 2023-03-02 ENCOUNTER — Encounter: Payer: Self-pay | Admitting: Orthopaedic Surgery

## 2023-03-02 ENCOUNTER — Ambulatory Visit (INDEPENDENT_AMBULATORY_CARE_PROVIDER_SITE_OTHER): Payer: BC Managed Care – PPO | Admitting: Orthopaedic Surgery

## 2023-03-02 ENCOUNTER — Ambulatory Visit: Payer: BC Managed Care – PPO | Admitting: Orthopaedic Surgery

## 2023-03-02 VITALS — BP 103/68 | HR 70 | Ht 61.0 in | Wt 150.0 lb

## 2023-03-02 DIAGNOSIS — M545 Low back pain, unspecified: Secondary | ICD-10-CM | POA: Diagnosis not present

## 2023-03-02 NOTE — Patient Instructions (Signed)
While we are working on your approval for MRI please go ahead and call to schedule your appointment with [facility] within at least one (1) week.  ° [phone]  ° °

## 2023-03-02 NOTE — Progress Notes (Signed)
My back is only a little better.  She has lower back pain localized to the lower back.  She has no new trauma. She is taking her medicine and doing walks.  She has problems sleeping because of the pain.  She has been out of work.  She has no numbness, no weakness.  Spine/Pelvis examination:  Inspection:  Overall, sacoiliac joint benign and hips nontender; without crepitus or defects.   Thoracic spine inspection: Alignment normal without kyphosis present   Lumbar spine inspection:  Alignment  with normal lumbar lordosis, without scoliosis apparent.   Thoracic spine palpation:  without tenderness of spinal processes   Lumbar spine palpation: without tenderness of lumbar area; without tightness of lumbar muscles    Range of Motion:   Lumbar flexion, forward flexion is normal without pain or tenderness    Lumbar extension is full without pain or tenderness   Left lateral bend is normal without pain or tenderness   Right lateral bend is normal without pain or tenderness   Straight leg raising is normal  Strength & tone: normal   Stability overall normal stability  Encounter Diagnosis  Name Primary?   Lumbar pain Yes   I would like to get a MRI for her back as she is not progressing very well.  Return in three weeks.  Call if any problem.  Precautions discussed.  Electronically Signed Darreld Mclean, MD 7/2/20248:43 AM

## 2023-03-07 ENCOUNTER — Ambulatory Visit
Admission: RE | Admit: 2023-03-07 | Discharge: 2023-03-07 | Disposition: A | Discharge status: Home / Self Care | Payer: BC Managed Care – PPO | Source: Ambulatory Visit | Attending: Orthopaedic Surgery | Admitting: Orthopaedic Surgery

## 2023-03-07 DIAGNOSIS — M545 Low back pain, unspecified: Secondary | ICD-10-CM | POA: Diagnosis not present

## 2023-03-11 ENCOUNTER — Telehealth: Payer: Self-pay | Admitting: Orthopaedic Surgery

## 2023-03-11 NOTE — Telephone Encounter (Signed)
Dr. Sanjuan Dame pt - pt presented to the office in tears, wanting to go over her MRI results, she has an appt 7/16.  She is also requesting an OOW note.

## 2023-03-12 ENCOUNTER — Encounter (HOSPITAL_COMMUNITY): Payer: Self-pay | Admitting: *Deleted

## 2023-03-12 ENCOUNTER — Other Ambulatory Visit: Payer: Self-pay

## 2023-03-12 ENCOUNTER — Emergency Department (HOSPITAL_COMMUNITY)
Admission: EM | Admit: 2023-03-12 | Discharge: 2023-03-12 | Disposition: A | Discharge status: Home / Self Care | Payer: BC Managed Care – PPO | Attending: Emergency Medicine | Admitting: Emergency Medicine

## 2023-03-12 DIAGNOSIS — M545 Low back pain, unspecified: Secondary | ICD-10-CM | POA: Diagnosis not present

## 2023-03-12 DIAGNOSIS — M5459 Other low back pain: Secondary | ICD-10-CM | POA: Diagnosis not present

## 2023-03-12 DIAGNOSIS — G8929 Other chronic pain: Secondary | ICD-10-CM | POA: Insufficient documentation

## 2023-03-12 MED ORDER — LIDOCAINE 5 % EX PTCH
1.0000 | MEDICATED_PATCH | CUTANEOUS | Status: DC
  Administered 2023-03-12: 1 via TRANSDERMAL
  Filled 2023-03-12: qty 1

## 2023-03-12 NOTE — Discharge Instructions (Addendum)
You have been seen today for your complaint of low back pain. Your imaging including MRI of your spine was reassuring and showed no acute abnormalities. Your discharge medications include lidocaine patches.  These are over-the-counter.  You may apply 1 patch every 12 hours for pain. Follow up with: Your orthopedic doctor as scheduled Please seek immediate medical care if you develop any of the following symptoms: You are not able to control when you pee or poop. You have bad back pain and: You feel like you may vomit (nauseous). You vomit. You have pain in your chest or your belly (abdomen). You have shortness of breath. You faint. At this time there does not appear to be the presence of an emergent medical condition, however there is always the potential for conditions to change. Please read and follow the below instructions.  Do not take your medicine if  develop an itchy rash, swelling in your mouth or lips, or difficulty breathing; call 911 and seek immediate emergency medical attention if this occurs.  You may review your lab tests and imaging results in their entirety on your MyChart account.  Please discuss all results of fully with your primary care provider and other specialist at your follow-up visit.  Note: Portions of this text may have been transcribed using voice recognition software. Every effort was made to ensure accuracy; however, inadvertent computerized transcription errors may still be present.
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
## 2023-03-12 NOTE — ED Notes (Signed)
ED Provider at bedside. 

## 2023-03-16 ENCOUNTER — Encounter: Payer: Self-pay | Admitting: Orthopaedic Surgery

## 2023-03-16 ENCOUNTER — Ambulatory Visit (INDEPENDENT_AMBULATORY_CARE_PROVIDER_SITE_OTHER): Payer: BC Managed Care – PPO | Admitting: Orthopaedic Surgery

## 2023-03-16 VITALS — BP 106/82 | Ht 61.0 in | Wt 150.0 lb

## 2023-03-16 DIAGNOSIS — M545 Low back pain, unspecified: Secondary | ICD-10-CM

## 2023-03-16 MED ORDER — PREDNISONE 5 MG (21) PO TBPK: ORAL_TABLET | ORAL | 0 refills | Status: DC

## 2023-03-16 MED ORDER — HYDROCODONE-ACETAMINOPHEN 7.5-325 MG PO TABS: ORAL_TABLET | ORAL | 0 refills | Status: DC

## 2023-03-16 MED ORDER — CYCLOBENZAPRINE HCL 10 MG PO TABS: 10.0000 mg | ORAL_TABLET | Freq: Every day | ORAL | 0 refills | Status: AC

## 2023-03-16 NOTE — Patient Instructions (Addendum)
We are referring you to [facility] from [facility] address is [address] [place] The phone number is [phone]  The office will call you with an appointment for injection in your back Dr. Alvester Morin   Physical therapy has been ordered for you at [facility]. They should call you to schedule, [phone] is the phone number to call, if you want to call to schedule.   Steps to Quit Smoking Smoking tobacco is the leading cause of preventable death. It can affect almost every organ in the body. Smoking puts you and people around you at risk for many serious, long-lasting (chronic) diseases. Quitting smoking can be hard, but it is one of the best things that you can do for your health. It is never too late to quit. Do not give up if you cannot quit the first time. Some people need to try many times to quit. Do your best to stick to your quit plan, and talk with your doctor if you have any questions or concerns. How do I get ready to quit? Pick a date to quit. Set a date within the next 2 weeks to give you time to prepare. Write down the reasons why you are quitting. Keep this list in places where you will see it often. Tell your family, friends, and co-workers that you are quitting. Their support is important. Talk with your doctor about the choices that may help you quit. Find out if your health insurance will pay for these treatments. Know the people, places, things, and activities that make you want to smoke (triggers). Avoid them. What first steps can I take to quit smoking? Throw away all cigarettes at home, at work, and in your car. Throw away the things that you use when you smoke, such as ashtrays and lighters. Clean your car. Empty the ashtray. Clean your home, including curtains and carpets. What can I do to help me quit smoking? Talk with your doctor about taking medicines and seeing a counselor. You are more likely to succeed when you do  both. If you are pregnant or breastfeeding: Talk with your doctor about counseling or other ways to quit smoking. Do not take medicine to help you quit smoking unless your doctor tells you to. Quit right away Quit smoking completely, instead of slowly cutting back on how much you smoke over a period of time. Stopping smoking right away may be more successful than slowly quitting. Go to counseling. In-person is best if this is an option. You are more likely to quit if you go to counseling sessions regularly. Take medicine You may take medicines to help you quit. Some medicines need a prescription, and some you can buy over-the-counter. Some medicines may contain a drug called nicotine to replace the nicotine in cigarettes. Medicines may: Help you stop having the desire to smoke (cravings). Help to stop the problems that come when you stop smoking (withdrawal symptoms). Your doctor may ask you to use: Nicotine patches, gum, or lozenges. Nicotine inhalers or sprays. Non-nicotine medicine that you take by mouth. Find resources Find resources and other ways to help you quit smoking and remain smoke-free after you quit. They include: Online chats with a Veterinary surgeon. Phone quitlines. Printed Materials engineer. Support groups or group counseling. Text messaging programs. Mobile phone apps. Use apps on your mobile phone or tablet that can help you stick to your quit plan. Examples of free services include Quit Guide from the [facility] and  smokefree.gov  What can I do to make it easier to quit?  Talk to your family and friends. Ask them to support and encourage you. Call a phone quitline, such as 1-800-QUIT-NOW, reach out to support groups, or work with a Veterinary surgeon. Ask people who smoke to not smoke around you. Avoid places that make you want to smoke, such as: Bars. Parties. Smoke-break areas at work. Spend time with people who do not smoke. Lower the stress in your life. Stress can make you want  to smoke. Try these things to lower stress: Getting regular exercise. Doing deep-breathing exercises. Doing yoga. Meditating. What benefits will I see if I quit smoking? Over time, you may have: A better sense of smell and taste. Less coughing and sore throat. A slower heart rate. Lower blood pressure. Clearer skin. Better breathing. Fewer sick days. Summary Quitting smoking can be hard, but it is one of the best things that you can do for your health. Do not give up if you cannot quit the first time. Some people need to try many times to quit. When you decide to quit smoking, make a plan to help you succeed. Quit smoking right away, not slowly over a period of time. When you start quitting, get help and support to keep you smoke-free. This information is not intended to replace advice given to you by your health care provider. Make sure you discuss any questions you have with your health care provider. Document Revised: 08/08/2021 Document Reviewed: 08/08/2021 Elsevier Patient Education  2024 [employer].

## 2023-03-16 NOTE — Progress Notes (Signed)
I am worse.  She went to the ER last week and was seen for her back. She tried to go to work but had increased pain. She had to come home.  She is not getting any better.  She had MRI of the lumbar spine showing: IMPRESSION: 1. No evidence of fracture or spondylolisthesis. 2. No evidence of spinal canal stenosis or neural foraminal stenosis. 3. Moderate bilateral facet joint arthropathy at L5-S1 and mild facet joint arthropathy at L4-L5.  I have explained the findings to her.  I will have her see Dr. Alvester Morin for epidural.  I will change her medicine to prednisone dose pack, stop the Robaxin, Begin Flexeril and I will give pain medicine.  I have reviewed the [place] Controlled Substance Reporting System web site prior to prescribing narcotic medicine for this patient.  She is tender in the lower back but no spasm, muscle tone and strength is normal, ROM is good, NV intact.  Encounter Diagnosis  Name Primary?   Lumbar pain Yes   Return in one week.  Stay out of work.  Begin PT.  Call if any problem.  Precautions discussed.  Electronically Signed Darreld Mclean, MD 7/16/20248:17 AM

## 2023-03-17 ENCOUNTER — Ambulatory Visit: Payer: BC Managed Care – PPO | Admitting: Orthopaedic Surgery

## 2023-03-19 ENCOUNTER — Encounter: Payer: Self-pay | Admitting: Orthopaedic Surgery

## 2023-03-22 ENCOUNTER — Telehealth: Payer: Self-pay | Admitting: Physical Medicine and Rehabilitation

## 2023-03-22 NOTE — Telephone Encounter (Signed)
Patient called. She would like an appointment with Dr. Newton.  

## 2023-03-23 ENCOUNTER — Encounter: Payer: Self-pay | Admitting: Orthopaedic Surgery

## 2023-03-23 ENCOUNTER — Ambulatory Visit (INDEPENDENT_AMBULATORY_CARE_PROVIDER_SITE_OTHER): Payer: BC Managed Care – PPO | Admitting: Orthopaedic Surgery

## 2023-03-23 VITALS — BP 117/76 | HR 78 | Ht 61.0 in | Wt 150.0 lb

## 2023-03-23 DIAGNOSIS — M545 Low back pain, unspecified: Secondary | ICD-10-CM

## 2023-03-23 NOTE — Telephone Encounter (Signed)
Spoke with patient and scheduled injection for 04/05/23. Patient aware driver needed
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
## 2023-04-01 ENCOUNTER — Telehealth: Payer: Self-pay | Admitting: Orthopaedic Surgery

## 2023-04-01 NOTE — Telephone Encounter (Signed)
[place] forms received. To [employer]. 

## 2023-04-05 ENCOUNTER — Ambulatory Visit (INDEPENDENT_AMBULATORY_CARE_PROVIDER_SITE_OTHER): Payer: BC Managed Care – PPO | Admitting: Physical Medicine and Rehabilitation

## 2023-04-05 ENCOUNTER — Other Ambulatory Visit: Payer: Self-pay

## 2023-04-05 VITALS — BP 117/81 | HR 65

## 2023-04-05 DIAGNOSIS — M47816 Spondylosis without myelopathy or radiculopathy, lumbar region: Secondary | ICD-10-CM

## 2023-04-05 MED ORDER — METHYLPREDNISOLONE ACETATE 80 MG/ML IJ SUSP
80.0000 mg | Freq: Once | INTRAMUSCULAR | Status: AC
Start: 2023-04-05 — End: 2023-04-05
  Administered 2023-04-05: 80 mg

## 2023-04-05 NOTE — Patient Instructions (Signed)

## 2023-04-05 NOTE — Progress Notes (Signed)
Kendra Farmer - 32 y.o. female MRN [id number]  Date of birth: [birth year]  Office Visit Note: Visit Date: 04/05/2023 PCP: Debroah Baller, FNP Referred by: Darreld Mclean, MD  Subjective: Chief Complaint  Patient presents with   Lower Back - Pain   HPI:  Kendra Farmer is a 32 y.o. female who comes in today at the request of Dr. Darreld Mclean for planned Bilateral  L5-S1 Lumbar facet/medial branch block with fluoroscopic guidance.  The patient has failed conservative care including home exercise, medications, time and activity modification.  This injection will be diagnostic and hopefully therapeutic.  Please see requesting physician notes for further details and justification.  Exam has shown concordant pain with facet joint loading. Could consider pelvic MRI. Starting PT soon.     ROS Otherwise per HPI.  Assessment & Plan: Visit Diagnoses:    ICD-10-CM   1. Spondylosis without myelopathy or radiculopathy, lumbar region  M47.816 XR C-ARM NO REPORT    Facet Injection    methylPREDNISolone acetate (DEPO-MEDROL) injection 80 mg      Plan: No additional findings.   Meds & Orders:  Meds ordered this encounter  Medications   methylPREDNISolone acetate (DEPO-MEDROL) injection 80 mg    Orders Placed This Encounter  Procedures   Facet Injection   XR C-ARM NO REPORT    Follow-up: Return for visit to requesting provider as needed.   Procedures: No procedures performed  Lumbar Facet Joint Intra-Articular Injection(s) with Fluoroscopic Guidance  Patient: Kendra Farmer      Date of Birth: Apr 15, 1991 MRN: [id number] PCP: Debroah Baller, FNP      Visit Date: 04/05/2023   Universal Protocol:    Date/Time: 04/05/2023  Consent Given By: the patient  Position: PRONE   Additional Comments: Vital signs were monitored before and after the procedure. Patient was prepped and draped in the usual sterile fashion. The correct patient, procedure, and site was  verified.   Injection Procedure Details:  Procedure Site One Meds Administered:  Meds ordered this encounter  Medications   methylPREDNISolone acetate (DEPO-MEDROL) injection 80 mg     Laterality: Bilateral  Location/Site:  L5-S1  Needle size: 22 guage  Needle type: Spinal  Needle Placement: Articular  Findings:  -Comments: Excellent flow of contrast producing a partial arthrogram.  Procedure Details: The fluoroscope beam is vertically oriented in AP, and the inferior recess is visualized beneath the lower pole of the inferior apophyseal process, which represents the target point for needle insertion. When direct visualization is difficult the target point is located at the medial projection of the vertebral pedicle. The region overlying each aforementioned target is locally anesthetized with a 1 to 2 ml. volume of 1% Lidocaine without Epinephrine.   The spinal needle was inserted into each of the above mentioned facet joints using biplanar fluoroscopic guidance. A 0.25 to 0.5 ml. volume of Isovue-250 was injected and a partial facet joint arthrogram was obtained. A single spot film was obtained of the resulting arthrogram.    One to 1.25 ml of the steroid/anesthetic solution was then injected into each of the facet joints noted above.   Additional Comments:  No complications occurred Dressing: 2 x 2 sterile gauze and Band-Aid    Post-procedure details: Patient was observed during the procedure. Post-procedure instructions were reviewed.  Patient left the clinic in stable condition.    Clinical History: MRI LUMBAR SPINE WITHOUT CONTRAST   TECHNIQUE: Multiplanar, multisequence MR imaging of the lumbar spine was performed. No intravenous contrast  was administered.   COMPARISON:  None Available.   FINDINGS: Segmentation:   5 non rib-bearing lumbar type vertebral bodies are present. The lowest fully formed vertebral body is L5.   Alignment:  Physiologic.    Vertebrae:  No fracture, evidence of discitis, or bone lesion.   Conus medullaris and cauda equina: Conus extends to the L1 level. Conus and cauda equina appear normal.   Paraspinal and other soft tissues: Negative.   Disc levels:   T12-L1: No significant disc bulge. No neural foraminal stenosis. No central canal stenosis.   L1-L2: No significant disc bulge. No neural foraminal stenosis. No central canal stenosis.   L2-L3: No significant disc bulge. No neural foraminal stenosis. No central canal stenosis.   L3-L4: No significant disc bulge. No neural foraminal stenosis. No central canal stenosis.   L4-L5: No significant disc bulge. No neural foraminal stenosis. No central canal stenosis. Mild bilateral facet joint arthropathy.   L5-S1: No significant disc bulge. No neural foraminal stenosis. No central canal stenosis. Moderate bilateral facet joint arthropathy.   IMPRESSION: 1. No evidence of fracture or spondylolisthesis. 2. No evidence of spinal canal stenosis or neural foraminal stenosis. 3. Moderate bilateral facet joint arthropathy at L5-S1 and mild facet joint arthropathy at L4-L5.     Electronically Signed   By: Larose Hires D.O.   On: 03/10/2023 23:28     Objective:  VS:  HT:    WT:   BMI:     BP:117/81  HR:65bpm  TEMP: ( )  RESP:  Physical Exam Vitals and nursing note reviewed.  Constitutional:      General: She is not in acute distress.    Appearance: Normal appearance. She is not ill-appearing.  HENT:     Head: Normocephalic and atraumatic.     Right Ear: External ear normal.     Left Ear: External ear normal.  Eyes:     Extraocular Movements: Extraocular movements intact.  Cardiovascular:     Rate and Rhythm: Normal rate.     Pulses: Normal pulses.  Pulmonary:     Effort: Pulmonary effort is normal. No respiratory distress.  Abdominal:     General: There is no distension.     Palpations: Abdomen is soft.  Musculoskeletal:         General: Tenderness present.     Cervical back: Neck supple.     Right lower leg: No edema.     Left lower leg: No edema.     Comments: Patient has good distal strength with no pain over the greater trochanters.  No clonus or focal weakness.  Skin:    Findings: No erythema, lesion or rash.  Neurological:     General: No focal deficit present.     Mental Status: She is alert and oriented to person, place, and time.     Sensory: No sensory deficit.     Motor: No weakness or abnormal muscle tone.     Coordination: Coordination normal.  Psychiatric:        Mood and Affect: Mood normal.        Behavior: Behavior normal.      Imaging: No results found.

## 2023-04-05 NOTE — Procedures (Signed)
Lumbar Facet Joint Intra-Articular Injection(s) with Fluoroscopic Guidance  Patient: Kendra Farmer      Date of Birth: November 12, 1990 MRN: [id number] PCP: Debroah Baller, FNP      Visit Date: 04/05/2023   Universal Protocol:    Date/Time: 04/05/2023  Consent Given By: the patient  Position: PRONE   Additional Comments: Vital signs were monitored before and after the procedure. Patient was prepped and draped in the usual sterile fashion. The correct patient, procedure, and site was verified.   Injection Procedure Details:  Procedure Site One Meds Administered:  Meds ordered this encounter  Medications   methylPREDNISolone acetate (DEPO-MEDROL) injection 80 mg     Laterality: Bilateral  Location/Site:  L5-S1  Needle size: 22 guage  Needle type: Spinal  Needle Placement: Articular  Findings:  -Comments: Excellent flow of contrast producing a partial arthrogram.  Procedure Details: The fluoroscope beam is vertically oriented in AP, and the inferior recess is visualized beneath the lower pole of the inferior apophyseal process, which represents the target point for needle insertion. When direct visualization is difficult the target point is located at the medial projection of the vertebral pedicle. The region overlying each aforementioned target is locally anesthetized with a 1 to 2 ml. volume of 1% Lidocaine without Epinephrine.   The spinal needle was inserted into each of the above mentioned facet joints using biplanar fluoroscopic guidance. A 0.25 to 0.5 ml. volume of Isovue-250 was injected and a partial facet joint arthrogram was obtained. A single spot film was obtained of the resulting arthrogram.    One to 1.25 ml of the steroid/anesthetic solution was then injected into each of the facet joints noted above.   Additional Comments:  No complications occurred Dressing: 2 x 2 sterile gauze and Band-Aid    Post-procedure details: Patient was observed during  the procedure. Post-procedure instructions were reviewed.  Patient left the clinic in stable condition.

## 2023-04-05 NOTE — Progress Notes (Signed)
Functional Pain Scale - descriptive words and definitions  Unmanageable (7)  Pain interferes with normal ADL's/nothing seems to help/sleep is very difficult/active distractions are very difficult to concentrate on. Severe range order  Average Pain 9   +Driver, -BT, -Dye Allergies.  Lower back pain on both sides

## 2023-04-06 ENCOUNTER — Encounter: Payer: Self-pay | Admitting: Orthopaedic Surgery

## 2023-04-06 ENCOUNTER — Ambulatory Visit: Payer: BC Managed Care – PPO | Admitting: Orthopaedic Surgery

## 2023-04-06 VITALS — BP 107/71 | HR 67 | Ht 61.0 in | Wt 155.2 lb

## 2023-04-06 DIAGNOSIS — M47816 Spondylosis without myelopathy or radiculopathy, lumbar region: Secondary | ICD-10-CM | POA: Diagnosis not present

## 2023-04-06 DIAGNOSIS — M545 Low back pain, unspecified: Secondary | ICD-10-CM

## 2023-04-06 NOTE — Progress Notes (Signed)
I do not feel good.  She had the epidural by Dr. Alvester Morin yesterday.  She is sore and not feeling well today.  She has no new trauma.  She says the medicine by mouth does not help.  She has limited ROM of the back secondary to pain.  She has no spasm.  Muscle tone and strength is normal.  NV intact.  Encounter Diagnoses  Name Primary?   Spondylosis without myelopathy or radiculopathy, lumbar region Yes   Lumbar pain    I have completed disability forms for her today.  Stay out of work.  She starts PT August 12.  Return in three weeks.  Call if any problem.  Precautions discussed.  Electronically Signed Darreld Mclean, MD 8/6/20248:21 AM

## 2023-04-06 NOTE — Patient Instructions (Signed)
OUT OF WORK ?

## 2023-04-12 ENCOUNTER — Ambulatory Visit (HOSPITAL_COMMUNITY): Payer: BC Managed Care – PPO | Attending: Orthopaedic Surgery | Admitting: Physical Therapy

## 2023-04-12 ENCOUNTER — Other Ambulatory Visit: Payer: Self-pay

## 2023-04-12 DIAGNOSIS — R262 Difficulty in walking, not elsewhere classified: Secondary | ICD-10-CM | POA: Insufficient documentation

## 2023-04-12 DIAGNOSIS — M545 Low back pain, unspecified: Secondary | ICD-10-CM | POA: Diagnosis not present

## 2023-04-12 DIAGNOSIS — M5459 Other low back pain: Secondary | ICD-10-CM | POA: Insufficient documentation
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
## 2023-04-15 ENCOUNTER — Ambulatory Visit (HOSPITAL_COMMUNITY): Payer: BC Managed Care – PPO

## 2023-04-15 DIAGNOSIS — M5459 Other low back pain: Secondary | ICD-10-CM

## 2023-04-15 DIAGNOSIS — M545 Low back pain, unspecified: Secondary | ICD-10-CM | POA: Diagnosis not present

## 2023-04-15 DIAGNOSIS — R262 Difficulty in walking, not elsewhere classified: Secondary | ICD-10-CM
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
## 2023-04-16 ENCOUNTER — Ambulatory Visit (HOSPITAL_COMMUNITY): Payer: BC Managed Care – PPO

## 2023-04-16 DIAGNOSIS — M5459 Other low back pain: Secondary | ICD-10-CM

## 2023-04-16 DIAGNOSIS — R262 Difficulty in walking, not elsewhere classified: Secondary | ICD-10-CM | POA: Diagnosis not present

## 2023-04-16 DIAGNOSIS — M545 Low back pain, unspecified: Secondary | ICD-10-CM | POA: Diagnosis not present
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
## 2023-04-19 ENCOUNTER — Ambulatory Visit (HOSPITAL_COMMUNITY): Payer: BC Managed Care – PPO

## 2023-04-19 DIAGNOSIS — M5459 Other low back pain: Secondary | ICD-10-CM

## 2023-04-19 DIAGNOSIS — R262 Difficulty in walking, not elsewhere classified: Secondary | ICD-10-CM | POA: Diagnosis not present

## 2023-04-19 DIAGNOSIS — M545 Low back pain, unspecified: Secondary | ICD-10-CM | POA: Diagnosis not present
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
## 2023-04-21 ENCOUNTER — Ambulatory Visit (HOSPITAL_COMMUNITY): Payer: BC Managed Care – PPO

## 2023-04-21 DIAGNOSIS — M545 Low back pain, unspecified: Secondary | ICD-10-CM | POA: Diagnosis not present

## 2023-04-21 DIAGNOSIS — R262 Difficulty in walking, not elsewhere classified: Secondary | ICD-10-CM | POA: Diagnosis not present

## 2023-04-21 DIAGNOSIS — M5459 Other low back pain: Secondary | ICD-10-CM | POA: Diagnosis not present
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
## 2023-04-23 ENCOUNTER — Ambulatory Visit (HOSPITAL_COMMUNITY): Payer: BC Managed Care – PPO

## 2023-04-23 DIAGNOSIS — R262 Difficulty in walking, not elsewhere classified: Secondary | ICD-10-CM | POA: Diagnosis not present

## 2023-04-23 DIAGNOSIS — M545 Low back pain, unspecified: Secondary | ICD-10-CM | POA: Diagnosis not present

## 2023-04-23 DIAGNOSIS — M5459 Other low back pain: Secondary | ICD-10-CM | POA: Diagnosis not present
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
## 2023-04-26 ENCOUNTER — Encounter (HOSPITAL_COMMUNITY): Payer: BC Managed Care – PPO

## 2023-04-27 ENCOUNTER — Encounter: Payer: Self-pay | Admitting: Orthopaedic Surgery

## 2023-04-27 ENCOUNTER — Ambulatory Visit (INDEPENDENT_AMBULATORY_CARE_PROVIDER_SITE_OTHER): Payer: BC Managed Care – PPO | Admitting: Orthopaedic Surgery

## 2023-04-27 VITALS — BP 117/61 | HR 61 | Ht 61.0 in | Wt 155.0 lb

## 2023-04-27 DIAGNOSIS — M545 Low back pain, unspecified: Secondary | ICD-10-CM | POA: Diagnosis not present

## 2023-04-27 NOTE — Patient Instructions (Signed)
OOW note for another 3 weeks

## 2023-04-27 NOTE — Progress Notes (Signed)
I still hurt.  She has been to PT for her back.  She is no worse but also no better.  She has chronic pain of the mid lower back with no radiation.  She is not sleeping well.  The epidural only helped slightly.  She is doing her exercises.  She has no new trauma.  She has no weakness.  ROM of the lower back is good but not full.  NV intact.  Muscle tone and strength is normal.  Gait is normal.  Encounter Diagnosis  Name Primary?   Lumbar pain Yes   Continue PT and exercises at home.  Stay out of work.  Return in three weeks.  Call if any problem.  Precautions discussed.  Electronically Signed Darreld Mclean, MD 8/27/20248:46 AM

## 2023-04-28 ENCOUNTER — Ambulatory Visit (HOSPITAL_COMMUNITY): Payer: BC Managed Care – PPO

## 2023-04-28 DIAGNOSIS — R262 Difficulty in walking, not elsewhere classified: Secondary | ICD-10-CM | POA: Diagnosis not present

## 2023-04-28 DIAGNOSIS — M545 Low back pain, unspecified: Secondary | ICD-10-CM | POA: Diagnosis not present

## 2023-04-28 DIAGNOSIS — M5459 Other low back pain: Secondary | ICD-10-CM | POA: Diagnosis not present
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
## 2023-04-30 ENCOUNTER — Ambulatory Visit (HOSPITAL_COMMUNITY): Payer: BC Managed Care – PPO

## 2023-04-30 DIAGNOSIS — M5459 Other low back pain: Secondary | ICD-10-CM | POA: Diagnosis not present

## 2023-04-30 DIAGNOSIS — M545 Low back pain, unspecified: Secondary | ICD-10-CM | POA: Diagnosis not present

## 2023-04-30 DIAGNOSIS — R262 Difficulty in walking, not elsewhere classified: Secondary | ICD-10-CM
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
## 2023-05-14 ENCOUNTER — Ambulatory Visit (HOSPITAL_COMMUNITY): Payer: BC Managed Care – PPO | Attending: Orthopaedic Surgery

## 2023-05-14 DIAGNOSIS — R262 Difficulty in walking, not elsewhere classified: Secondary | ICD-10-CM | POA: Diagnosis present

## 2023-05-14 DIAGNOSIS — M5459 Other low back pain: Secondary | ICD-10-CM | POA: Diagnosis present
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
## 2023-05-18 ENCOUNTER — Encounter: Payer: Self-pay | Admitting: Orthopaedic Surgery

## 2023-05-18 ENCOUNTER — Ambulatory Visit (INDEPENDENT_AMBULATORY_CARE_PROVIDER_SITE_OTHER): Payer: BC Managed Care – PPO | Admitting: Orthopaedic Surgery

## 2023-05-18 VITALS — BP 126/81 | HR 62 | Ht 63.0 in | Wt 155.0 lb

## 2023-05-18 DIAGNOSIS — M545 Low back pain, unspecified: Secondary | ICD-10-CM

## 2023-05-18 NOTE — Progress Notes (Signed)
I am still hurting.  Her lower back is still painful.  She has been to PT faithfully and has done the exercises.  I have read the notes.  She is considering acupuncture. She is doing the exercises at home regularly.  She has no new trauma.  She had no results from epidural on 04-05-23.  Exam is good.  Muscle tone and strength is normal.  NV intact. ROM is good but painful in the extremes.  Encounter Diagnosis  Name Primary?   Lumbar pain Yes   I have read the notes from PT.  I have given her a number to call about acupuncture.  She will return to work October 1.  I will see her a week later on October 8.  Call if any problem.  Precautions discussed.  Electronically Signed Darreld Mclean, MD 9/17/20248:30 AM

## 2023-05-18 NOTE — Addendum Note (Signed)
Addended by: Seymour Bars on: 05/18/2023 03:38 PM   Modules accepted: Orders

## 2023-05-18 NOTE — Patient Instructions (Signed)
OOW Note to return to work on June 01, 2023

## 2023-05-19 ENCOUNTER — Ambulatory Visit (HOSPITAL_COMMUNITY): Payer: BC Managed Care – PPO

## 2023-05-19 ENCOUNTER — Encounter (HOSPITAL_COMMUNITY): Payer: BC Managed Care – PPO

## 2023-05-19 DIAGNOSIS — M5459 Other low back pain: Secondary | ICD-10-CM

## 2023-05-19 DIAGNOSIS — R262 Difficulty in walking, not elsewhere classified: Secondary | ICD-10-CM
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
## 2023-05-21 ENCOUNTER — Ambulatory Visit (HOSPITAL_COMMUNITY): Payer: BC Managed Care – PPO

## 2023-05-21 ENCOUNTER — Encounter (HOSPITAL_COMMUNITY): Payer: BC Managed Care – PPO

## 2023-05-21 DIAGNOSIS — M5459 Other low back pain: Secondary | ICD-10-CM

## 2023-05-21 DIAGNOSIS — R262 Difficulty in walking, not elsewhere classified: Secondary | ICD-10-CM
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
## 2023-05-26 ENCOUNTER — Encounter (HOSPITAL_COMMUNITY): Payer: BC Managed Care – PPO

## 2023-05-28 ENCOUNTER — Encounter (HOSPITAL_COMMUNITY): Payer: BC Managed Care – PPO

## 2023-06-01 ENCOUNTER — Encounter: Payer: Self-pay | Admitting: Orthopaedic Surgery

## 2023-06-09 ENCOUNTER — Ambulatory Visit (INDEPENDENT_AMBULATORY_CARE_PROVIDER_SITE_OTHER): Payer: BC Managed Care – PPO | Admitting: Orthopaedic Surgery

## 2023-06-09 ENCOUNTER — Encounter: Payer: Self-pay | Admitting: Orthopaedic Surgery

## 2023-06-09 VITALS — BP 123/68 | HR 60 | Ht 63.0 in | Wt 155.0 lb

## 2023-06-09 DIAGNOSIS — M545 Low back pain, unspecified: Secondary | ICD-10-CM

## 2023-06-09 NOTE — Progress Notes (Signed)
I am no better.  She has chronic back pain that is not improving.  She is very depressed about this.  She is scheduled to have acupuncture in another week. Epidurals did not help, PT has not helped, exercises have not helped.  She would like a cane and I will give Rx for this.  Back is diffusely tender, NV intact, ROM painful, no spasm, muscle tone and strength normal.  Encounter Diagnosis  Name Primary?   Lumbar pain Yes   She has insurance forms to be completed.  I will sign them when they are done.  Go to the acupuncture.  Stay out of work.   Keep up her exercise walking that she is doing.  Return in one month.  Call if any problem.  Precautions discussed.  Electronically Signed Darreld Mclean, MD 10/9/20248:50 AM

## 2023-06-10 ENCOUNTER — Telehealth: Payer: Self-pay | Admitting: Orthopaedic Surgery

## 2023-06-10 NOTE — Telephone Encounter (Signed)
[place] forms received. To [employer]. 

## 2023-06-11 ENCOUNTER — Ambulatory Visit (HOSPITAL_COMMUNITY): Payer: BC Managed Care – PPO | Attending: Orthopaedic Surgery

## 2023-06-11 DIAGNOSIS — M5459 Other low back pain: Secondary | ICD-10-CM | POA: Insufficient documentation

## 2023-06-11 DIAGNOSIS — R262 Difficulty in walking, not elsewhere classified: Secondary | ICD-10-CM | POA: Diagnosis present
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
## 2023-06-17 ENCOUNTER — Ambulatory Visit (HOSPITAL_COMMUNITY): Payer: BC Managed Care – PPO

## 2023-06-17 DIAGNOSIS — M5459 Other low back pain: Secondary | ICD-10-CM | POA: Diagnosis not present

## 2023-06-17 DIAGNOSIS — R262 Difficulty in walking, not elsewhere classified: Secondary | ICD-10-CM
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
## 2023-06-24 ENCOUNTER — Ambulatory Visit (HOSPITAL_COMMUNITY): Payer: BC Managed Care – PPO

## 2023-06-24 DIAGNOSIS — M5459 Other low back pain: Secondary | ICD-10-CM

## 2023-06-24 DIAGNOSIS — R262 Difficulty in walking, not elsewhere classified: Secondary | ICD-10-CM
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
## 2023-07-01 ENCOUNTER — Ambulatory Visit (HOSPITAL_COMMUNITY): Payer: BC Managed Care – PPO

## 2023-07-01 DIAGNOSIS — R262 Difficulty in walking, not elsewhere classified: Secondary | ICD-10-CM

## 2023-07-01 DIAGNOSIS — M5459 Other low back pain: Secondary | ICD-10-CM
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
## 2023-07-06 ENCOUNTER — Other Ambulatory Visit: Payer: BC Managed Care – PPO | Admitting: Adult Health

## 2023-07-08 ENCOUNTER — Ambulatory Visit (HOSPITAL_COMMUNITY): Payer: BC Managed Care – PPO

## 2023-07-08 ENCOUNTER — Encounter (HOSPITAL_COMMUNITY): Payer: Self-pay

## 2023-07-12 ENCOUNTER — Telehealth: Payer: Self-pay | Admitting: Orthopaedic Surgery

## 2023-07-12 NOTE — Telephone Encounter (Signed)
[place] forms received. To [employer]. 

## 2023-07-12 NOTE — Telephone Encounter (Signed)
Void previous message/[place] forms not going to [employer]. [employer] staff to complete.

## 2023-07-13 ENCOUNTER — Ambulatory Visit: Payer: BC Managed Care – PPO | Admitting: Orthopaedic Surgery

## 2023-07-14 ENCOUNTER — Encounter: Payer: Self-pay | Admitting: Orthopaedic Surgery

## 2023-07-14 ENCOUNTER — Ambulatory Visit (INDEPENDENT_AMBULATORY_CARE_PROVIDER_SITE_OTHER): Payer: BC Managed Care – PPO | Admitting: Orthopaedic Surgery

## 2023-07-14 DIAGNOSIS — M545 Low back pain, unspecified: Secondary | ICD-10-CM

## 2023-07-14 NOTE — Progress Notes (Signed)
I am no better.  She has chronic pain of the lower back.  She has had PT and they will discharge her as she has not improved.  She tried acupuncture and it did not help.  She has done the exercises and other treatments and is not better.  She has chronic pain.  She has no weakness, no new trauma.  ROM of the back is good, NV intact, no spasm, muscle tone and strength is normal.  Encounter Diagnosis  Name Primary?   Lumbar pain Yes   Stay out of work. She has been terminated from her job.  Continue to be as active as possible.  I have no new suggestions.  Return in two months.  Call if any problem.  Precautions discussed.  Electronically Signed Darreld Mclean, MD 11/13/20248:29 AM

## 2023-07-15 ENCOUNTER — Ambulatory Visit (HOSPITAL_COMMUNITY): Payer: BC Managed Care – PPO | Attending: Orthopaedic Surgery

## 2023-07-15 DIAGNOSIS — R262 Difficulty in walking, not elsewhere classified: Secondary | ICD-10-CM | POA: Diagnosis present

## 2023-07-15 DIAGNOSIS — M5459 Other low back pain: Secondary | ICD-10-CM | POA: Insufficient documentation
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
## 2023-07-22 ENCOUNTER — Encounter (HOSPITAL_COMMUNITY): Payer: BC Managed Care – PPO

## 2023-07-22 ENCOUNTER — Telehealth: Payer: Self-pay

## 2023-07-22 ENCOUNTER — Telehealth: Payer: Self-pay | Admitting: Orthopaedic Surgery

## 2023-07-22 ENCOUNTER — Other Ambulatory Visit: Payer: Self-pay

## 2023-07-22 MED ORDER — HYDROCODONE-ACETAMINOPHEN 7.5-325 MG PO TABS: ORAL_TABLET | ORAL | 0 refills | Status: DC

## 2023-07-27 MED ORDER — HYDROCODONE-ACETAMINOPHEN 7.5-325 MG PO TABS: ORAL_TABLET | ORAL | 0 refills | Status: DC

## 2023-07-27 NOTE — Addendum Note (Signed)
Addended by: Earnstine Regal on: 07/27/2023 09:00 AM   Modules accepted: Orders

## 2023-07-27 NOTE — Addendum Note (Signed)
Addended by: Michaele Offer on: 07/27/2023 08:30 AM   Modules accepted: Orders

## 2023-07-28 ENCOUNTER — Encounter (HOSPITAL_COMMUNITY): Payer: BC Managed Care – PPO

## 2023-08-02 NOTE — Telephone Encounter (Signed)
Spoke with patient and she sated that the [facility] needs information sent to them with the limitations  Per Dr Hilda Lias patient is to do no lifting, bending and sitting and standing are for short periods at time as needed for the patent. Patient stated she spoke to [place] and I told her I would let Toniann Fail know I spoke to her

## 2023-08-05 ENCOUNTER — Encounter: Payer: Self-pay | Admitting: Orthopaedic Surgery

## 2023-09-15 ENCOUNTER — Ambulatory Visit: Payer: BC Managed Care – PPO | Admitting: Orthopaedic Surgery

## 2023-09-15 ENCOUNTER — Encounter: Payer: Self-pay | Admitting: Orthopaedic Surgery

## 2023-09-15 VITALS — BP 108/66 | HR 56 | Ht 63.0 in | Wt 165.0 lb

## 2023-09-15 DIAGNOSIS — M47816 Spondylosis without myelopathy or radiculopathy, lumbar region: Secondary | ICD-10-CM

## 2023-09-15 DIAGNOSIS — M545 Low back pain, unspecified: Secondary | ICD-10-CM

## 2023-09-15 NOTE — Progress Notes (Signed)
 I hurt all the time.  She continues to have lower back pain that will not resolve.  She tries to be active.  She has no weakness or new trauma.  Cold weather makes her worse.  Spine/Pelvis examination:  Inspection:  Overall, sacoiliac joint benign and hips nontender; without crepitus or defects.   Thoracic spine inspection: Alignment normal without kyphosis present   Lumbar spine inspection:  Alignment  with normal lumbar lordosis, without scoliosis apparent.   Thoracic spine palpation:  without tenderness of spinal processes   Lumbar spine palpation: without tenderness of lumbar area; without tightness of lumbar muscles    Range of Motion:   Lumbar flexion, forward flexion is normal without pain or tenderness    Lumbar extension is full without pain or tenderness   Left lateral bend is normal without pain or tenderness   Right lateral bend is normal without pain or tenderness   Straight leg raising is normal  Strength & tone: normal   Stability overall normal stability Encounter Diagnoses  Name Primary?   Spondylosis without myelopathy or radiculopathy, lumbar region Yes   Lumbar pain    I have no major suggestions for her.  All that has been done has not helped her.  I have encouraged her to be as active as she can be.  Return in two months.  Call if any problem.  Precautions discussed.  Electronically Signed Pleasant Brilliant, MD 1/15/20258:21 AM

## 2023-11-03 ENCOUNTER — Telehealth: Payer: Self-pay | Admitting: Orthopaedic Surgery

## 2023-11-03 MED ORDER — HYDROCODONE-ACETAMINOPHEN 7.5-325 MG PO TABS: ORAL_TABLET | ORAL | 0 refills | Status: AC

## 2023-11-10 ENCOUNTER — Encounter: Payer: Self-pay | Admitting: Orthopaedic Surgery

## 2023-11-10 ENCOUNTER — Ambulatory Visit: Admitting: Orthopaedic Surgery

## 2023-11-10 VITALS — BP 128/80 | HR 68 | Ht 63.0 in | Wt 164.0 lb

## 2023-11-10 DIAGNOSIS — M545 Low back pain, unspecified: Secondary | ICD-10-CM

## 2023-11-10 NOTE — Patient Instructions (Signed)
 We are referring you to [facility] from [facility] address is [address] [place] The phone number is [phone]  The office will call you with an appointment Dr. Christell Constant

## 2023-11-10 NOTE — Progress Notes (Signed)
 I am hurting more.  She had a bad weekend with lower back pain to the right.  She could not get out of bed for a day or two.  She is tired of hurting.  Prior tests have been negative.  Exam shows tenderness of the lower back, no spasm, ROM good, NV intact, muscle tone and strength normal.  Encounter Diagnosis  Name Primary?   Lumbar pain Yes   I will have Dr. Christell Constant see her and see if he has any suggestions for care.  She is agreeable to this.  Call if any problem.  Precautions discussed.  Electronically Signed Darreld Mclean, MD 3/12/202510:14 AM

## 2023-11-17 ENCOUNTER — Ambulatory Visit: Payer: BC Managed Care – PPO | Admitting: Orthopaedic Surgery

## 2023-11-25 ENCOUNTER — Other Ambulatory Visit (INDEPENDENT_AMBULATORY_CARE_PROVIDER_SITE_OTHER): Payer: Self-pay

## 2023-11-25 ENCOUNTER — Ambulatory Visit (INDEPENDENT_AMBULATORY_CARE_PROVIDER_SITE_OTHER): Admitting: Orthopedic Surgery

## 2023-11-25 VITALS — BP 124/77 | HR 57 | Ht 63.0 in | Wt 165.0 lb

## 2023-11-25 DIAGNOSIS — M545 Low back pain, unspecified: Secondary | ICD-10-CM
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
## 2023-12-21 ENCOUNTER — Encounter (HOSPITAL_COMMUNITY): Payer: Self-pay | Admitting: *Deleted

## 2023-12-21 ENCOUNTER — Other Ambulatory Visit: Payer: Self-pay

## 2023-12-21 ENCOUNTER — Emergency Department (HOSPITAL_COMMUNITY)

## 2023-12-21 ENCOUNTER — Emergency Department (HOSPITAL_COMMUNITY): Admission: EM | Admit: 2023-12-21 | Discharge: 2023-12-21 | Discharge status: Against Medical Advice

## 2023-12-21 DIAGNOSIS — S92502D Displaced unspecified fracture of left lesser toe(s), subsequent encounter for fracture with routine healing: Secondary | ICD-10-CM | POA: Diagnosis not present

## 2023-12-21 DIAGNOSIS — W1789XA Other fall from one level to another, initial encounter: Secondary | ICD-10-CM | POA: Insufficient documentation

## 2023-12-21 DIAGNOSIS — M79675 Pain in left toe(s): Secondary | ICD-10-CM | POA: Insufficient documentation

## 2023-12-21 DIAGNOSIS — Z5321 Procedure and treatment not carried out due to patient leaving prior to being seen by health care provider: Secondary | ICD-10-CM | POA: Insufficient documentation

## 2023-12-21 DIAGNOSIS — S92535D Nondisplaced fracture of distal phalanx of left lesser toe(s), subsequent encounter for fracture with routine healing: Secondary | ICD-10-CM | POA: Diagnosis not present

## 2023-12-21 DIAGNOSIS — R2242 Localized swelling, mass and lump, left lower limb: Secondary | ICD-10-CM | POA: Insufficient documentation

## 2023-12-21 NOTE — ED Notes (Signed)
 Pt decided to leave without being seen said wait was too long.
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
## 2024-01-03 ENCOUNTER — Ambulatory Visit: Admitting: Adult Health

## 2024-02-08 DIAGNOSIS — Z130 Encounter for screening for diseases of the blood and blood-forming organs and certain disorders involving the immune mechanism: Secondary | ICD-10-CM | POA: Diagnosis not present

## 2024-02-08 DIAGNOSIS — Z7689 Persons encountering health services in other specified circumstances: Secondary | ICD-10-CM | POA: Diagnosis not present

## 2024-02-08 DIAGNOSIS — Z131 Encounter for screening for diabetes mellitus: Secondary | ICD-10-CM | POA: Diagnosis not present

## 2024-02-08 DIAGNOSIS — M5442 Lumbago with sciatica, left side: Secondary | ICD-10-CM | POA: Diagnosis not present

## 2024-02-08 DIAGNOSIS — Z0000 Encounter for general adult medical examination without abnormal findings: Secondary | ICD-10-CM | POA: Diagnosis not present

## 2024-02-08 DIAGNOSIS — G8929 Other chronic pain: Secondary | ICD-10-CM | POA: Diagnosis not present

## 2024-02-08 DIAGNOSIS — Z113 Encounter for screening for infections with a predominantly sexual mode of transmission: Secondary | ICD-10-CM | POA: Diagnosis not present

## 2024-02-08 DIAGNOSIS — Z1159 Encounter for screening for other viral diseases: Secondary | ICD-10-CM | POA: Diagnosis not present

## 2024-02-08 DIAGNOSIS — Z1329 Encounter for screening for other suspected endocrine disorder: Secondary | ICD-10-CM | POA: Diagnosis not present

## 2024-02-08 DIAGNOSIS — Z13220 Encounter for screening for lipoid disorders: Secondary | ICD-10-CM | POA: Diagnosis not present

## 2024-02-08 DIAGNOSIS — Z23 Encounter for immunization: Secondary | ICD-10-CM | POA: Diagnosis not present

## 2024-02-08 DIAGNOSIS — Z1321 Encounter for screening for nutritional disorder: Secondary | ICD-10-CM | POA: Diagnosis not present

## 2024-04-21 ENCOUNTER — Encounter: Payer: Self-pay | Admitting: Radiology

## 2024-07-03 ENCOUNTER — Encounter: Payer: Self-pay | Admitting: Radiology

## 2024-07-24 ENCOUNTER — Other Ambulatory Visit: Payer: Self-pay

## 2024-07-24 ENCOUNTER — Ambulatory Visit: Admitting: Orthopedic Surgery

## 2024-07-24 DIAGNOSIS — M533 Sacrococcygeal disorders, not elsewhere classified: Secondary | ICD-10-CM

## 2024-07-24 DIAGNOSIS — G8929 Other chronic pain: Secondary | ICD-10-CM | POA: Diagnosis not present

## 2024-07-24 DIAGNOSIS — M545 Low back pain, unspecified: Secondary | ICD-10-CM
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
## 2024-08-04 ENCOUNTER — Encounter: Payer: Self-pay | Admitting: Sports Medicine

## 2024-08-04 ENCOUNTER — Ambulatory Visit: Admitting: Sports Medicine

## 2024-08-04 ENCOUNTER — Other Ambulatory Visit: Payer: Self-pay

## 2024-08-04 DIAGNOSIS — M533 Sacrococcygeal disorders, not elsewhere classified: Secondary | ICD-10-CM

## 2024-08-04 DIAGNOSIS — M545 Low back pain, unspecified: Secondary | ICD-10-CM | POA: Diagnosis not present

## 2024-08-04 DIAGNOSIS — G8929 Other chronic pain: Secondary | ICD-10-CM

## 2024-08-04 DIAGNOSIS — M7918 Myalgia, other site: Secondary | ICD-10-CM | POA: Diagnosis not present
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
## 2024-08-11 ENCOUNTER — Encounter: Payer: Self-pay | Admitting: Physical Medicine & Rehabilitation

## 2024-10-03 ENCOUNTER — Encounter: Admitting: Physical Medicine & Rehabilitation
